# Patient Record
Sex: Female | Born: 1935 | Race: White | Hispanic: No | Smoking: Never smoker
Health system: Southern US, Community
[De-identification: ages and names within clinical notes are randomized; demographics above are authoritative.]

## PROBLEM LIST (undated history)

## (undated) DIAGNOSIS — J984 Other disorders of lung: Secondary | ICD-10-CM

## (undated) DIAGNOSIS — E039 Hypothyroidism, unspecified: Secondary | ICD-10-CM

## (undated) DIAGNOSIS — E871 Hypo-osmolality and hyponatremia: Secondary | ICD-10-CM

## (undated) HISTORY — DX: Hypo-osmolality and hyponatremia: E87.1

## (undated) HISTORY — DX: Other disorders of lung: J98.4

## (undated) HISTORY — PX: APPENDECTOMY: SHX54

## (undated) HISTORY — DX: Hypothyroidism, unspecified: E03.9

---
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
## 2006-03-04 ENCOUNTER — Emergency Department: Payer: Self-pay | Admitting: Unknown Physician Specialty

## 2010-08-21 ENCOUNTER — Inpatient Hospital Stay: Payer: Self-pay | Admitting: Specialist

## 2010-08-24 LAB — PATHOLOGY REPORT

## 2010-08-26 ENCOUNTER — Encounter: Payer: Self-pay | Admitting: Internal Medicine

## 2010-09-20 ENCOUNTER — Encounter: Payer: Self-pay | Admitting: Internal Medicine

## 2012-05-22 HISTORY — PX: HIP SURGERY: SHX245

## 2013-08-06 DIAGNOSIS — H2510 Age-related nuclear cataract, unspecified eye: Secondary | ICD-10-CM | POA: Diagnosis not present

## 2014-03-10 DIAGNOSIS — Z23 Encounter for immunization: Secondary | ICD-10-CM | POA: Diagnosis not present

## 2014-07-13 DIAGNOSIS — H04121 Dry eye syndrome of right lacrimal gland: Secondary | ICD-10-CM | POA: Diagnosis not present

## 2015-07-02 ENCOUNTER — Emergency Department: Payer: Medicare Other

## 2015-07-02 ENCOUNTER — Encounter: Payer: Self-pay | Admitting: Emergency Medicine

## 2015-07-02 ENCOUNTER — Inpatient Hospital Stay
Admission: EM | Admit: 2015-07-02 | Discharge: 2015-07-05 | DRG: 641 | Disposition: A | Discharge status: Skilled Nursing Facility | Payer: Medicare Other | Attending: Internal Medicine | Admitting: Internal Medicine

## 2015-07-02 DIAGNOSIS — E039 Hypothyroidism, unspecified: Secondary | ICD-10-CM | POA: Diagnosis present

## 2015-07-02 DIAGNOSIS — R7989 Other specified abnormal findings of blood chemistry: Secondary | ICD-10-CM | POA: Diagnosis present

## 2015-07-02 DIAGNOSIS — M6281 Muscle weakness (generalized): Secondary | ICD-10-CM | POA: Diagnosis not present

## 2015-07-02 DIAGNOSIS — R778 Other specified abnormalities of plasma proteins: Secondary | ICD-10-CM

## 2015-07-02 DIAGNOSIS — E860 Dehydration: Secondary | ICD-10-CM | POA: Diagnosis present

## 2015-07-02 DIAGNOSIS — Z9889 Other specified postprocedural states: Secondary | ICD-10-CM

## 2015-07-02 DIAGNOSIS — R748 Abnormal levels of other serum enzymes: Secondary | ICD-10-CM | POA: Diagnosis present

## 2015-07-02 DIAGNOSIS — E559 Vitamin D deficiency, unspecified: Secondary | ICD-10-CM | POA: Diagnosis present

## 2015-07-02 DIAGNOSIS — R946 Abnormal results of thyroid function studies: Secondary | ICD-10-CM | POA: Diagnosis present

## 2015-07-02 DIAGNOSIS — Z9119 Patient's noncompliance with other medical treatment and regimen: Secondary | ICD-10-CM

## 2015-07-02 DIAGNOSIS — I10 Essential (primary) hypertension: Secondary | ICD-10-CM | POA: Diagnosis present

## 2015-07-02 DIAGNOSIS — Z79899 Other long term (current) drug therapy: Secondary | ICD-10-CM | POA: Diagnosis not present

## 2015-07-02 DIAGNOSIS — R531 Weakness: Secondary | ICD-10-CM

## 2015-07-02 DIAGNOSIS — W19XXXA Unspecified fall, initial encounter: Secondary | ICD-10-CM | POA: Diagnosis not present

## 2015-07-02 DIAGNOSIS — E871 Hypo-osmolality and hyponatremia: Secondary | ICD-10-CM | POA: Diagnosis present

## 2015-07-02 DIAGNOSIS — Z8249 Family history of ischemic heart disease and other diseases of the circulatory system: Secondary | ICD-10-CM | POA: Diagnosis not present

## 2015-07-02 DIAGNOSIS — Z886 Allergy status to analgesic agent status: Secondary | ICD-10-CM

## 2015-07-02 DIAGNOSIS — S22000A Wedge compression fracture of unspecified thoracic vertebra, initial encounter for closed fracture: Secondary | ICD-10-CM | POA: Diagnosis not present

## 2015-07-02 DIAGNOSIS — Z66 Do not resuscitate: Secondary | ICD-10-CM | POA: Diagnosis present

## 2015-07-02 DIAGNOSIS — Z9181 History of falling: Secondary | ICD-10-CM | POA: Diagnosis not present

## 2015-07-02 DIAGNOSIS — R749 Abnormal serum enzyme level, unspecified: Secondary | ICD-10-CM | POA: Diagnosis not present

## 2015-07-02 DIAGNOSIS — R410 Disorientation, unspecified: Secondary | ICD-10-CM | POA: Diagnosis not present

## 2015-07-02 DIAGNOSIS — R2689 Other abnormalities of gait and mobility: Secondary | ICD-10-CM | POA: Diagnosis not present

## 2015-07-02 HISTORY — DX: Other disorders of lung: J98.4

## 2015-07-02 LAB — URINALYSIS COMPLETE WITH MICROSCOPIC (ARMC ONLY)
BACTERIA UA: NONE SEEN
Bilirubin Urine: NEGATIVE
Glucose, UA: 50 mg/dL — AB
Leukocytes, UA: NEGATIVE
NITRITE: NEGATIVE
PROTEIN: 30 mg/dL — AB
Specific Gravity, Urine: 1.012 (ref 1.005–1.030)
pH: 8 (ref 5.0–8.0)

## 2015-07-02 LAB — TSH: TSH: 20.329 u[IU]/mL — AB (ref 0.350–4.500)

## 2015-07-02 LAB — COMPREHENSIVE METABOLIC PANEL
ALT: 16 U/L (ref 14–54)
AST: 73 U/L — ABNORMAL HIGH (ref 15–41)
Albumin: 4.1 g/dL (ref 3.5–5.0)
Alkaline Phosphatase: 64 U/L (ref 38–126)
Anion gap: 12 (ref 5–15)
BUN: 10 mg/dL (ref 6–20)
CHLORIDE: 91 mmol/L — AB (ref 101–111)
CO2: 24 mmol/L (ref 22–32)
CREATININE: 0.87 mg/dL (ref 0.44–1.00)
Calcium: 8.8 mg/dL — ABNORMAL LOW (ref 8.9–10.3)
GFR, EST NON AFRICAN AMERICAN: 59 mL/min — AB (ref >60)
Glucose, Bld: 150 mg/dL — ABNORMAL HIGH (ref 65–99)
POTASSIUM: 3.8 mmol/L (ref 3.5–5.1)
Sodium: 127 mmol/L — ABNORMAL LOW (ref 135–145)
TOTAL PROTEIN: 8.6 g/dL — AB (ref 6.5–8.1)
Total Bilirubin: 1.3 mg/dL — ABNORMAL HIGH (ref 0.3–1.2)

## 2015-07-02 LAB — CBC WITH DIFFERENTIAL/PLATELET
BASOS ABS: 0 10*3/uL (ref 0–0.1)
BASOS PCT: 0 %
Eosinophils Absolute: 0 10*3/uL (ref 0–0.7)
Eosinophils Relative: 0 %
HCT: 41.5 % (ref 35.0–47.0)
HEMOGLOBIN: 13.7 g/dL (ref 12.0–16.0)
LYMPHS PCT: 7 %
Lymphs Abs: 0.4 10*3/uL — ABNORMAL LOW (ref 1.0–3.6)
MCH: 28.8 pg (ref 26.0–34.0)
MCHC: 33 g/dL (ref 32.0–36.0)
MCV: 87.3 fL (ref 80.0–100.0)
Monocytes Absolute: 0.6 10*3/uL (ref 0.2–0.9)
Monocytes Relative: 11 %
Neutro Abs: 4.7 10*3/uL (ref 1.4–6.5)
Neutrophils Relative %: 82 %
Platelets: 258 10*3/uL (ref 150–440)
RBC: 4.75 MIL/uL (ref 3.80–5.20)
RDW: 14.1 % (ref 11.5–14.5)
WBC: 5.7 10*3/uL (ref 3.6–11.0)

## 2015-07-02 LAB — TROPONIN I
TROPONIN I: 0.35 ng/mL — AB (ref <0.031)
TROPONIN I: 0.34 ng/mL — AB (ref <0.031)
TROPONIN I: 0.31 ng/mL — AB (ref <0.031)

## 2015-07-02 LAB — CK: CK TOTAL: 1608 U/L — AB (ref 38–234)

## 2015-07-02 MED ORDER — SODIUM CHLORIDE 0.9% FLUSH
3.0000 mL | Freq: Two times a day (BID) | INTRAVENOUS | Status: DC
  Administered 2015-07-03 (×2): 3 mL via INTRAVENOUS

## 2015-07-02 MED ORDER — ACETAMINOPHEN 650 MG RE SUPP: 650.0000 mg | Freq: Four times a day (QID) | RECTAL | Status: DC | PRN

## 2015-07-02 MED ORDER — ASPIRIN EC 81 MG PO TBEC
81.0000 mg | DELAYED_RELEASE_TABLET | Freq: Every day | ORAL | Status: DC
  Administered 2015-07-03 – 2015-07-05 (×3): 81 mg via ORAL
  Filled 2015-07-02 (×3): qty 1

## 2015-07-02 MED ORDER — CEFUROXIME AXETIL 500 MG PO TABS: 500.0000 mg | ORAL_TABLET | Freq: Two times a day (BID) | ORAL | Status: DC

## 2015-07-02 MED ORDER — ONDANSETRON HCL 4 MG/2ML IJ SOLN: 4.0000 mg | Freq: Four times a day (QID) | INTRAMUSCULAR | Status: DC | PRN

## 2015-07-02 MED ORDER — METOPROLOL TARTRATE 25 MG PO TABS
12.5000 mg | ORAL_TABLET | Freq: Two times a day (BID) | ORAL | Status: DC
  Administered 2015-07-02 – 2015-07-05 (×6): 12.5 mg via ORAL
  Filled 2015-07-02 (×6): qty 1

## 2015-07-02 MED ORDER — ASPIRIN 81 MG PO CHEW
324.0000 mg | CHEWABLE_TABLET | Freq: Once | ORAL | Status: AC
  Administered 2015-07-02: 324 mg via ORAL
  Filled 2015-07-02: qty 4

## 2015-07-02 MED ORDER — ACETAMINOPHEN 325 MG PO TABS
650.0000 mg | ORAL_TABLET | Freq: Four times a day (QID) | ORAL | Status: DC | PRN
  Administered 2015-07-03 – 2015-07-04 (×2): 650 mg via ORAL
  Filled 2015-07-02 (×3): qty 2

## 2015-07-02 MED ORDER — SODIUM CHLORIDE 0.9 % IV BOLUS (SEPSIS)
1000.0000 mL | Freq: Once | INTRAVENOUS | Status: AC
  Administered 2015-07-02: 1000 mL via INTRAVENOUS

## 2015-07-02 MED ORDER — SODIUM CHLORIDE 0.9 % IV SOLN
INTRAVENOUS | Status: DC
  Administered 2015-07-02 – 2015-07-05 (×5): via INTRAVENOUS

## 2015-07-02 MED ORDER — ONDANSETRON HCL 4 MG PO TABS: 4.0000 mg | ORAL_TABLET | Freq: Four times a day (QID) | ORAL | Status: DC | PRN

## 2015-07-02 MED ORDER — LEVOTHYROXINE SODIUM 75 MCG PO TABS
75.0000 ug | ORAL_TABLET | Freq: Every day | ORAL | Status: DC
  Administered 2015-07-03: 75 ug via ORAL
  Filled 2015-07-02: qty 1

## 2015-07-02 MED ORDER — ENOXAPARIN SODIUM 40 MG/0.4ML ~~LOC~~ SOLN
40.0000 mg | SUBCUTANEOUS | Status: DC
  Administered 2015-07-02: 18:00:00 40 mg via SUBCUTANEOUS
  Filled 2015-07-02: qty 0.4

## 2015-07-02 NOTE — H&P (Signed)
[facility] - [place] at [facility]   PATIENT NAME: Maria Walker    MR#:  [id number]  DATE OF BIRTH:  [birth year]  DATE OF ADMISSION:  07/02/2015  PRIMARY CARE PHYSICIAN: Dr. Quillian Quince REQUESTING/REFERRING PHYSICIAN: Dr. Shaune Pollack  CHIEF COMPLAINT:   Chief Complaint  Patient presents with  . Weakness    HISTORY OF PRESENT ILLNESS: Maria Walker  is a 82 y.o. female with a known history of upper thyroidism who currently lives by herself. Her family was called by her neighbor because he noticed that patient was on the floor. When EMS arrived patient had a hard time getting off from the floor. She has not been eating or drinking much recently. Her daughter and granddaughter feel that she is very dehydrated. Patient in the ER was noted to have a sodium of 127 and troponin thus elevated. She denies any chest pain.    PAST MEDICAL HISTORY:   Past Medical History  Diagnosis Date  . Thyroid disease   . Apical lung scarring     PAST SURGICAL HISTORY: Past Surgical History  Procedure Laterality Date  . Hip surgery      SOCIAL HISTORY:  Social History  Substance Use Topics  . Smoking status: Never Smoker   . Smokeless tobacco: Not on file  . Alcohol Use: No    FAMILY HISTORY:  Family History  Problem Relation Age of Onset  . Hypertension           DRUG ALLERGIES:  Allergies  Allergen Reactions  . Codeine Other (See Comments)    Reaction:  Hallucinations     REVIEW OF SYSTEMS:   CONSTITUTIONAL: No fever, positive fatigue and weakness.  EYES: No blurred or double vision.  EARS, NOSE, AND THROAT: No tinnitus or ear pain.  RESPIRATORY: No cough, shortness of breath, wheezing or hemoptysis.  CARDIOVASCULAR: No chest pain, orthopnea, edema.  GASTROINTESTINAL: No nausea, vomiting, diarrhea or abdominal pain.  GENITOURINARY: No dysuria, hematuria.  ENDOCRINE: No polyuria, nocturia,  HEMATOLOGY: No anemia, easy bruising or bleeding SKIN: No rash or  lesion. MUSCULOSKELETAL: No joint pain or arthritis.   NEUROLOGIC: No tingling, numbness, weakness.  PSYCHIATRY: No anxiety or depression.   MEDICATIONS AT HOME:  Prior to Admission medications   Medication Sig Start Date End Date Taking? Authorizing Provider  levothyroxine (SYNTHROID, LEVOTHROID) 75 MCG tablet Take 75 mcg by mouth daily before breakfast.   Yes Historical Provider, MD      PHYSICAL EXAMINATION:   VITAL SIGNS: Blood pressure 135/83, pulse 106, temperature 98 F (36.7 C), temperature source Oral, resp. rate 23, height  (1.549 m), weight 42.411 kg (93 lb 8 oz), SpO2 96 %.  GENERAL:  82 y.o.-year-old patient lying in the bed with no acute distress.  EYES: Pupils equal, round, reactive to light and accommodation. No scleral icterus. Extraocular muscles intact.  HEENT: Head atraumatic, normocephalic. Oropharynx and nasopharynx clear.  NECK:  Supple, no jugular venous distention. No thyroid enlargement, no tenderness.  LUNGS: Normal breath sounds bilaterally, no wheezing, rales,rhonchi or crepitation. No use of accessory muscles of respiration.  CARDIOVASCULAR: S1, S2 normal. No murmurs, rubs, or gallops.  ABDOMEN: Soft, nontender, nondistended. Bowel sounds present. No organomegaly or mass.  EXTREMITIES: No pedal edema, cyanosis, or clubbing.  NEUROLOGIC: Cranial nerves II through XII are intact. Muscle strength 5/5 in all extremities. Sensation intact. Gait not checked.  PSYCHIATRIC: The patient is alert and oriented x 3.  SKIN: No obvious rash, lesion, or ulcer.  LABORATORY PANEL:   CBC  Recent Labs Lab 07/02/15 1225  WBC 5.7  HGB 13.7  HCT 41.5  PLT 258  MCV 87.3  MCH 28.8  MCHC 33.0  RDW 14.1  LYMPHSABS 0.4*  MONOABS 0.6  EOSABS 0.0  BASOSABS 0.0   ------------------------------------------------------------------------------------------------------------------  Chemistries   Recent Labs Lab 07/02/15 1225  NA 127*  K 3.8  CL 91*  CO2 24   GLUCOSE 150*  BUN 10  CREATININE 0.87  CALCIUM 8.8*  AST 73*  ALT 16  ALKPHOS 64  BILITOT 1.3*   ------------------------------------------------------------------------------------------------------------------ estimated creatinine clearance is 31.1 mL/min (by C-G formula based on Cr of 0.87). ------------------------------------------------------------------------------------------------------------------ No results for input(s): TSH, T4TOTAL, T3FREE, THYROIDAB in the last 72 hours.  Invalid input(s): FREET3   Coagulation profile No results for input(s): INR, PROTIME in the last 168 hours. ------------------------------------------------------------------------------------------------------------------- No results for input(s): DDIMER in the last 72 hours. -------------------------------------------------------------------------------------------------------------------  Cardiac Enzymes  Recent Labs Lab 07/02/15 1225  TROPONINI 0.35*   ------------------------------------------------------------------------------------------------------------------ Invalid input(s): POCBNP  ---------------------------------------------------------------------------------------------------------------  Urinalysis    Component Value Date/Time   COLORURINE YELLOW* 07/02/2015 1225   APPEARANCEUR CLEAR* 07/02/2015 1225   LABSPEC 1.012 07/02/2015 1225   PHURINE 8.0 07/02/2015 1225   GLUCOSEU 50* 07/02/2015 1225   HGBUR 2+* 07/02/2015 1225   BILIRUBINUR NEGATIVE 07/02/2015 1225   KETONESUR 1+* 07/02/2015 1225   PROTEINUR 30* 07/02/2015 1225   NITRITE NEGATIVE 07/02/2015 1225   LEUKOCYTESUR NEGATIVE 07/02/2015 1225     RADIOLOGY: Dg Chest 2 View  07/02/2015  CLINICAL DATA:  Status post fall out of a chair today. Initial encounter. CHEST  2 VIEW COMPARISON:  Single view of the chest 08/21/2010. CT thoracic spine 03/04/2006. FINDINGS: Lung volumes are somewhat low with mild left  basilar atelectasis. No consolidative process, pneumothorax or effusion. Heart size is normal. Bones are osteopenic. Since the prior examination, since the prior CT scan, the patient has suffered compression fractures of T6, T7, T8, T9. T10 and T11. There is also mild inferior endplate compression fracture of T10. IMPRESSION: No acute cardiopulmonary disease. Multiple thoracic spine compression fractures are new since 2007 but cannot be definitively characterized. T5 compression fracture is remote. Osteopenia. Electronically Signed   By: Drusilla Kanner M.D.   On: 07/02/2015 15:25   Ct Head Wo Contrast  07/02/2015  CLINICAL DATA:  Confusion. EXAM: CT HEAD WITHOUT CONTRAST TECHNIQUE: Contiguous axial images were obtained from the base of the skull through the vertex without intravenous contrast. COMPARISON:  None. FINDINGS: Periventricular white matter and corona radiata hypodensities favor chronic ischemic microvascular white matter disease. This is somewhat confluent in the left frontal periventricular white matter. Otherwise, the brainstem, cerebellum, cerebral peduncles, thalami, basal ganglia, basilar cisterns, and ventricular system appear within normal limits. No intracranial hemorrhage, mass lesion, or acute CVA. Chronic right posterior ethmoid and left sphenoid sinusitis. There is atherosclerotic calcification of the cavernous carotid arteries bilaterally. IMPRESSION: 1. No acute intracranial findings are identified. 2. Periventricular white matter and corona radiata hypodensities favor chronic ischemic microvascular white matter disease. 3. Chronic ethmoid and left sphenoid sinusitis. Electronically Signed   By: Gaylyn Rong M.D.   On: 07/02/2015 15:32    EKG: Orders placed or performed during the hospital encounter of 07/02/15  . ED EKG  . ED EKG  . EKG 12-Lead  . EKG 12-Lead  . EKG 12-Lead  . EKG 12-Lead    IMPRESSION AND PLAN: Patient is a 82 year old white female brought in to  the hospital with  complaint of generalized weakness and a fall  1. Generalized weakness and fall likely due to dehydration at this time will give her IV fluids physical therapy evaluation  2. Hyponatremia likely due to dehydration give her IV fluids Follow sodium level in the morning  3. Hypothyroidism continue Synthroid  4. Elevated troponin without cardiac symptoms I will check a CPK could be related to the fall demand ischemia  5. Miscellaneous heparin for DVT prophylaxis   All the records are reviewed and case discussed with ED provider. Management plans discussed with the patient, family and they are in agreement.  CODE STATUS:    Code Status Orders        Start     Ordered   07/02/15 1621  Full code   Continuous     07/02/15 1621    Code Status History    Date Active Date Inactive Code Status Order ID Comments User Context   This patient has a current code status but no historical code status.       TOTAL TIME TAKING CARE OF THIS PATIENT: 45 minutes.    Auburn Bilberry M.D on 07/02/2015 at 4:28 PM  Between 7am to 6pm - Pager - [phone]  After 6pm go to [email or web address] - password [email or web address] [facility]  [place]  Office  [phone]  CC: Primary care physician; No primary care provider on file.

## 2015-07-02 NOTE — ED Notes (Signed)
Pt taken to room 36 for urine sample and to be put into a gown.

## 2015-07-02 NOTE — ED Notes (Signed)
Pt arrived via EMS from home where she lives independently.  Pt has neighbors that check on her periodically and was found to have slid out of her recliner chair on the floor.  EMS reports patient may have been down for 10 minutes or less.  Pt unable to ambulate like she normally does per family.  EMS states they were at the pt's house for a significant period of time and was still unable to ambulate with EMS.  Pt also urinated on herself while she was on the floor.  Upon arrival pt is AOx4. Mucus membranes are dry.

## 2015-07-02 NOTE — ED Notes (Signed)
Dr. Shaune Pollack notified of patient's elevated troponin.

## 2015-07-02 NOTE — ED Provider Notes (Signed)
[facility] Emergency Department Provider Note   ____________________________________________  Time seen: Approximately 12 PM I have reviewed the triage vital signs and the triage nursing note.  HISTORY  Chief Complaint Weakness   Historian Patient  HPI Maria Walker is a 82 y.o. female who lives alone, and the neighbor checked on her today and found that she had slid out of her recliner. Patient states that her both legs have been weak for a few days. No one-sided weakness. No slurred speech. She states her mouth is dry and she hasn't really had anything to drink today. No fever. No coughing. The chest pain.  Initially had complained a little bit of low back pain, but now states she is not really having any pain. Symptoms are moderate.    Past Medical History  Diagnosis Date  . Thyroid disease   . Apical lung scarring     Patient Active Problem List   Diagnosis Date Noted  . Hyponatremia 07/02/2015    Past Surgical History  Procedure Laterality Date  . Hip surgery      No current outpatient prescriptions on file.  Allergies Codeine  Family History  Problem Relation Age of Onset  . Hypertension           Social History Social History  Substance Use Topics  . Smoking status: Never Smoker   . Smokeless tobacco: None  . Alcohol Use: No    Review of Systems  Constitutional: Negative for fever. Eyes: Negative for visual changes. ENT: Negative for sore throat. Cardiovascular: Negative for chest pain. Respiratory: Negative for shortness of breath. Gastrointestinal: Negative for abdominal pain, vomiting and diarrhea. Genitourinary: Negative for dysuria. Musculoskeletal: Negative for back pain. Skin: Negative for rash. Neurological: Negative for headache. 10 point Review of Systems otherwise negative ____________________________________________   PHYSICAL EXAM:  VITAL SIGNS: ED Triage Vitals  Enc Vitals Group     BP --       Pulse --      Resp --      Temp --      Temp src --      SpO2 07/02/15 1210 100 %     Weight --      Height --      Head Cir --      Peak Flow --      Pain Score 07/02/15 1214 0     Pain Loc --      Pain Edu? --      Excl. in GC? --      Constitutional: Alert and oriented. Well appearing and in no distress. HEENT   Head: Normocephalic and atraumatic.      Eyes: Conjunctivae are normal. PERRL. Normal extraocular movements.      Ears:         Nose: No congestion/rhinnorhea.   Mouth/Throat: Mucous membranes are moderately dry   Neck: No stridor. Cardiovascular/Chest: Tachycardic and regular  No murmurs, rubs, or gallops. Respiratory: Normal respiratory effort without tachypnea nor retractions. Breath sounds are clear and equal bilaterally. No wheezes/rales/rhonchi. Gastrointestinal: Soft. No distention, no guarding, no rebound. Nontender.    Genitourinary/rectal:Deferred Musculoskeletal: Pelvis stable full range of motion of the hips which are nontender. Nontender C-spine, T-spine, and L-spine to palpation. Nontender with normal range of motion in all extremities. No joint effusions.  No lower extremity tenderness.  No edema. Neurologic:  Normal speech and language. No gross or focal neurologic deficits are appreciated. Skin:  Skin is warm, dry and intact.  No rash noted. Psychiatric: Mood and affect are normal. Speech and behavior are normal. Patient exhibits appropriate insight and judgment.  ____________________________________________   EKG I, Governor Rooks, MD, the attending physician have personally viewed and interpreted all ECGs.  EKG 12:20 sinus tachycardia. 120 bpm. Narrow QRS. Normal axis. Occasional PVC. Nonspecific ST and T-wave  EKG 12:22 sinus tachycardia. 1 26 bpm. Narrow QRS. Undetermined axis. Nonspecific ST and T-wave ____________________________________________  LABS (pertinent positives/negatives)  Sodium 127, chloride 91 otherwise middle panel  without significant abnormality Troponin 0.35 White blood count 5.7, hemoglobin 13.7, platelet count 258 Urinalysis too numerous to count red blood cells, negative for bacteria and nitrites, 1+ ketones  ____________________________________________  RADIOLOGY All Xrays were viewed by me. Imaging interpreted by Radiologist.  Chest 2 view:  IMPRESSION: No acute cardiopulmonary disease.  Multiple thoracic spine compression fractures are new since 2007 but cannot be definitively characterized. T5 compression fracture is remote.  Osteopenia. __________________________________________  PROCEDURES  Procedure(s) performed: None  Critical Care performed: None  ____________________________________________   ED COURSE / ASSESSMENT AND PLAN  Pertinent labs & imaging results that were available during my care of the patient were reviewed by me and considered in my medical decision making (see chart for details).   Patient states that she's been weak for a couple of days. She does not have a focal deficit and I don't have any clinical concern for an acute stroke. She appears like she may be dehydrated. Her BUN and creatinine are within normal limits. She does have acute hyponatremia at 127, I am going to give her 1 L normal saline now.  Additionally, she is tachycardic with a minimally elevated troponin. I will give her 4 baby aspirin. Her EKG does not show an acute ischemia, but I will admit her for observation for evaluation and treatment of her abnormal troponin and weakness generalized, and hyponatremia.    CONSULTATIONS:   Face-to-face with hospitalist for admission.   Patient / Family / Caregiver informed of clinical course, medical decision-making process, and agree with plan.     ___________________________________________   FINAL CLINICAL IMPRESSION(S) / ED DIAGNOSES   Final diagnoses:  Troponin level elevated  Hyponatremia  Generalized weakness               Note: This dictation was prepared with Dragon dictation. Any transcriptional errors that result from this process are unintentional   Governor Rooks, MD 07/02/15 2125

## 2015-07-02 NOTE — Progress Notes (Addendum)
Day shift had called to get code status changed from full to DNR per pt request- Follow up call to change code status.  Dr. Elpidio Anis notified- order to be changed to DNR per pt wishes. Louis Meckel

## 2015-07-03 DIAGNOSIS — R7989 Other specified abnormal findings of blood chemistry: Secondary | ICD-10-CM | POA: Diagnosis present

## 2015-07-03 LAB — BASIC METABOLIC PANEL WITH GFR
Anion gap: 5 (ref 5–15)
BUN: 15 mg/dL (ref 6–20)
CO2: 24 mmol/L (ref 22–32)
Calcium: 7.4 mg/dL — ABNORMAL LOW (ref 8.9–10.3)
Chloride: 99 mmol/L — ABNORMAL LOW (ref 101–111)
Creatinine, Ser: 0.86 mg/dL (ref 0.44–1.00)
GFR calc Af Amer: >60 mL/min
GFR calc non Af Amer: 59 mL/min — ABNORMAL LOW
Glucose, Bld: 91 mg/dL (ref 65–99)
Potassium: 3.5 mmol/L (ref 3.5–5.1)
Sodium: 128 mmol/L — ABNORMAL LOW (ref 135–145)

## 2015-07-03 LAB — CBC
HCT: 34.6 % — ABNORMAL LOW (ref 35.0–47.0)
Hemoglobin: 11.6 g/dL — ABNORMAL LOW (ref 12.0–16.0)
MCH: 28.9 pg (ref 26.0–34.0)
MCHC: 33.5 g/dL (ref 32.0–36.0)
MCV: 86.3 fL (ref 80.0–100.0)
Platelets: 214 10*3/uL (ref 150–440)
RBC: 4.01 MIL/uL (ref 3.80–5.20)
RDW: 14.5 % (ref 11.5–14.5)
WBC: 4.6 10*3/uL (ref 3.6–11.0)

## 2015-07-03 LAB — BASIC METABOLIC PANEL
Anion gap: 7 (ref 5–15)
BUN: 15 mg/dL (ref 6–20)
CALCIUM: 7.9 mg/dL — AB (ref 8.9–10.3)
CO2: 24 mmol/L (ref 22–32)
CREATININE: 1.02 mg/dL — AB (ref 0.44–1.00)
Chloride: 96 mmol/L — ABNORMAL LOW (ref 101–111)
GFR, EST AFRICAN AMERICAN: 56 mL/min — AB (ref >60)
GFR, EST NON AFRICAN AMERICAN: 48 mL/min — AB (ref >60)
Glucose, Bld: 105 mg/dL — ABNORMAL HIGH (ref 65–99)
Potassium: 3.4 mmol/L — ABNORMAL LOW (ref 3.5–5.1)
Sodium: 127 mmol/L — ABNORMAL LOW (ref 135–145)

## 2015-07-03 LAB — SODIUM, URINE, RANDOM: Sodium, Ur: 189 mmol/L

## 2015-07-03 LAB — VITAMIN D 25 HYDROXY (VIT D DEFICIENCY, FRACTURES): Vit D, 25-Hydroxy: 20.5 ng/mL — ABNORMAL LOW (ref 30.0–100.0)

## 2015-07-03 LAB — CK: Total CK: 1086 U/L — ABNORMAL HIGH (ref 38–234)

## 2015-07-03 LAB — VITAMIN B12: VITAMIN B 12: 253 pg/mL (ref 180–914)

## 2015-07-03 LAB — OSMOLALITY, URINE: Osmolality, Ur: 546 mosm/kg (ref 300–900)

## 2015-07-03 LAB — TSH: TSH: 29.96 u[IU]/mL — ABNORMAL HIGH (ref 0.350–4.500)

## 2015-07-03 LAB — T4, FREE: Free T4: 0.59 ng/dL — ABNORMAL LOW (ref 0.61–1.12)

## 2015-07-03 LAB — TROPONIN I: TROPONIN I: 0.27 ng/mL — AB (ref <0.031)

## 2015-07-03 MED ORDER — LEVOTHYROXINE SODIUM 150 MCG PO TABS: 150.0000 ug | ORAL_TABLET | Freq: Every day | ORAL | Status: DC

## 2015-07-03 MED ORDER — LEVOTHYROXINE SODIUM 100 MCG IV SOLR
25.0000 ug | Freq: Once | INTRAVENOUS | Status: AC
Start: 2015-07-03 — End: 2015-07-03
  Administered 2015-07-03: 25 ug via INTRAVENOUS
  Filled 2015-07-03: qty 5

## 2015-07-03 MED ORDER — LEVOTHYROXINE SODIUM 100 MCG PO TABS
100.0000 ug | ORAL_TABLET | Freq: Every day | ORAL | Status: DC
  Administered 2015-07-04 – 2015-07-05 (×2): 100 ug via ORAL
  Filled 2015-07-03 (×2): qty 1

## 2015-07-03 MED ORDER — VITAMIN D 1000 UNITS PO TABS
1000.0000 [IU] | ORAL_TABLET | Freq: Every day | ORAL | Status: DC
  Administered 2015-07-03 – 2015-07-05 (×3): 1000 [IU] via ORAL
  Filled 2015-07-03 (×3): qty 1

## 2015-07-03 MED ORDER — ENOXAPARIN SODIUM 30 MG/0.3ML ~~LOC~~ SOLN
30.0000 mg | SUBCUTANEOUS | Status: DC
  Administered 2015-07-03: 18:00:00 30 mg via SUBCUTANEOUS
  Filled 2015-07-03: qty 0.3

## 2015-07-03 NOTE — Progress Notes (Signed)
Initial Nutrition Assessment   INTERVENTION:   Meals and Snacks: Cater to patient preferences Medical Food Supplement Therapy: will recommend Mighty Shakes on meal trays TID for added nutrition (each shake provides 300kcals and 9g protein) as pt reports diarrhea with Ensure/Boost Coordination of Care: will recommend collecting accurate weight.   NUTRITION DIAGNOSIS:   Inadequate oral intake related to poor appetite as evidenced by per patient/family report.  GOAL:   Patient will meet greater than or equal to 90% of their needs  MONITOR:    (Energy Intake, Electrolyte and renal Profile, Anthropometrics, Digestive system)  REASON FOR ASSESSMENT:   Malnutrition Screening Tool    ASSESSMENT:   Pt admitted with fall and dehydration with hyponatremia.   Past Medical History  Diagnosis Date  . Thyroid disease   . Apical lung scarring      Diet Order:  Diet regular Room service appropriate?: Yes; Fluid consistency:: Thin    Current Nutrition: Pt reports not having had lunch yet on visit but tray on its way. Pt reports eating breakfast this am, daughter reports pt ate pancakes very well.   Food/Nutrition-Related History: Pt daughter reports pt eats small portions three times a day. Pt's daughter reports pt really struggles drinking enough liquids, sometimes only 2 cups of coffee per day, but eats much better than she drinks liquids. Pt's daughter has encouraged pt to drink at least one [employer] a day. Pt has been drinking [employer] brand, Equate Protein shake with 30g protein mixed with milk but not daily.   Scheduled Medications:  . aspirin EC  81 mg Oral Daily  . cholecalciferol  1,000 Units Oral Daily  . enoxaparin (LOVENOX) injection  30 mg Subcutaneous Q24H  . levothyroxine  25 mcg Intravenous Once  . [START ON 07/04/2015] levothyroxine  150 mcg Oral QAC breakfast  . metoprolol tartrate  12.5 mg Oral BID  . sodium chloride flush  3 mL Intravenous Q12H    Continuous  Medications:  . sodium chloride 75 mL/hr at 07/03/15 0523     Electrolyte/Renal Profile and Glucose Profile:   Recent Labs Lab 07/02/15 1225 07/03/15 0406  NA 127* 127*  K 3.8 3.4*  CL 91* 96*  CO2 24 24  BUN 10 15  CREATININE 0.87 1.02*  CALCIUM 8.8* 7.9*  GLUCOSE 150* 105*   Protein Profile:   Recent Labs Lab 07/02/15 1225  ALBUMIN 4.1    Gastrointestinal Profile: Last BM:  07/01/2015   Nutrition-Focused Physical Exam Findings: Nutrition-Focused physical exam completed. Findings are no fat depletion, mild-moderate muscle depletion, and no edema.     Weight Change: Pt reports weight has been 102lbs usually. Pt's daughter reports thinking pt's weight is less now.  No further weight encounters per [facility]  Skin:   Reviewed, per pt wound on back where slid out of chair PTA  Height:   Ht Readings from Last 1 Encounters:  07/02/15  (1.549 m)    Weight:   Wt Readings from Last 1 Encounters:  07/02/15 102 lb (46.267 kg)   BMI:  Body mass index is 19.28 kg/(m^2).  Estimated Nutritional Needs:   Kcal:  BEE: 837kcals, TEE: (IF 1.1-1.3)(AF 1.2) 1105-1306kcals  Protein:  46-55g protein (1.0-1.2g/kg)  Fluid:  1150-1329mL of fluid (25-43mL/kg)  EDUCATION NEEDS:   No education needs identified at this time   MODERATE Care Level  Leda Quail, RD, LDN Pager [phone] Weekend/On-Call Pager [phone]

## 2015-07-03 NOTE — Clinical Social Work Placement (Signed)
   CLINICAL SOCIAL WORK PLACEMENT  NOTE  Date:  07/03/2015  Patient Details  Name: Maria Walker MRN: [id number] Date of Birth: [birth year]  Clinical Social Work is seeking post-discharge placement for this patient at the Skilled  Nursing Facility level of care (*CSW will initial, date and re-position this form in  chart as items are completed):  Yes   Patient/family provided with [place] Clinical Social Work Department's list of facilities offering this level of care within the geographic area requested by the patient (or if unable, by the patient's family).  Yes   Patient/family informed of their freedom to choose among providers that offer the needed level of care, that participate in Medicare, Medicaid or managed care program needed by the patient, have an available bed and are willing to accept the patient.  Yes   Patient/family informed of [facility]'s ownership interest in [facility] and [facility], as well as of the fact that they are under no obligation to receive care at these facilities.  PASRR submitted to EDS on       PASRR number received on       Existing PASRR number confirmed on 07/03/15     FL2 transmitted to all facilities in geographic area requested by pt/family on 07/03/15     FL2 transmitted to all facilities within larger geographic area on       Patient informed that his/her managed care company has contracts with or will negotiate with certain facilities, including the following:            Patient/family informed of bed offers received.  Patient chooses bed at       Physician recommends and patient chooses bed at      Patient to be transferred to   on  .  Patient to be transferred to facility by       Patient family notified on   of transfer.  Name of family member notified:        PHYSICIAN       Additional Comment:    _______________________________________________ Dede Query, LCSW 07/03/2015, 8:06 PM

## 2015-07-03 NOTE — Progress Notes (Signed)
[facility] [place] at [facility]   PATIENT NAME: Maria Walker    MR#:  [id number]  DATE OF BIRTH:  Jan 05, 1927  SUBJECTIVE:  CHIEF COMPLAINT:   Chief Complaint  Patient presents with  . Weakness   states that she feels somewhat better today however still weak, she had to ask for assistance to get out of bed   REVIEW OF SYSTEMS:  CONSTITUTIONAL: No fever, positive fatigue or weakness.  EYES: No blurred or double vision.  EARS, NOSE, AND THROAT: No tinnitus or ear pain.  RESPIRATORY: No cough, shortness of breath, wheezing or hemoptysis.  CARDIOVASCULAR: No chest pain, orthopnea, edema.  GASTROINTESTINAL: No nausea, vomiting, diarrhea or abdominal pain.  GENITOURINARY: No dysuria, hematuria.  ENDOCRINE: No polyuria, nocturia,  HEMATOLOGY: No anemia, easy bruising or bleeding SKIN: No rash or lesion. MUSCULOSKELETAL: No joint pain or arthritis.   NEUROLOGIC: No tingling, numbness, weakness.  PSYCHIATRY: No anxiety or depression.   DRUG ALLERGIES:   Allergies  Allergen Reactions  . Codeine Other (See Comments)    Reaction:  Hallucinations     VITALS:  Blood pressure 141/63, pulse 89, temperature 97.8 F (36.6 C), temperature source Oral, resp. rate 20, height  (1.549 m), weight 46.267 kg (102 lb), SpO2 100 %.  PHYSICAL EXAMINATION:  VITAL SIGNS: Filed Vitals:   07/03/15 0521 07/03/15 0929  BP: 117/72 141/63  Pulse: 85 89  Temp: 98.5 F (36.9 C) 97.8 F (36.6 C)  Resp: 20    GENERAL:82 y.o.female currently in no acute distress.  HEAD: Normocephalic, atraumatic.  EYES: Pupils equal, round, reactive to light. Extraocular muscles intact. No scleral icterus.  MOUTH: Moist mucosal membrane. Dentition intact. No abscess noted.  EAR, NOSE, THROAT: Clear without exudates. No external lesions.  NECK: Supple. No thyromegaly. No nodules. No JVD.  PULMONARY: Clear to ascultation, without wheeze rails or rhonci. No use of accessory muscles, Good  respiratory effort. good air entry bilaterally CHEST: Nontender to palpation.  CARDIOVASCULAR: S1 and S2. Regular rate and rhythm. No murmurs, rubs, or gallops. No edema. Pedal pulses 2+ bilaterally.  GASTROINTESTINAL: Soft, nontender, nondistended. No masses. Positive bowel sounds. No hepatosplenomegaly.  MUSCULOSKELETAL: No swelling, clubbing, or edema. Range of motion full in all extremities.  NEUROLOGIC: Cranial nerves II through XII are intact. No gross focal neurological deficits. Sensation intact. Reflexes intact.  SKIN: No ulceration, lesions, rashes, or cyanosis. Skin warm and dry. Turgor intact.  PSYCHIATRIC: Mood, affect within normal limits. The patient is awake, alert and oriented x 3. Insight, judgment intact.      LABORATORY PANEL:   CBC  Recent Labs Lab 07/03/15 0406  WBC 4.6  HGB 11.6*  HCT 34.6*  PLT 214   ------------------------------------------------------------------------------------------------------------------  Chemistries   Recent Labs Lab 07/02/15 1225 07/03/15 0406  NA 127* 127*  K 3.8 3.4*  CL 91* 96*  CO2 24 24  GLUCOSE 150* 105*  BUN 10 15  CREATININE 0.87 1.02*  CALCIUM 8.8* 7.9*  AST 73*  --   ALT 16  --   ALKPHOS 64  --   BILITOT 1.3*  --    ------------------------------------------------------------------------------------------------------------------  Cardiac Enzymes  Recent Labs Lab 07/03/15 0406  TROPONINI 0.27*   ------------------------------------------------------------------------------------------------------------------  RADIOLOGY:  Dg Chest 2 View  07/02/2015  CLINICAL DATA:  Status post fall out of a chair today. Initial encounter. CHEST  2 VIEW COMPARISON:  Single view of the chest 08/21/2010. CT thoracic spine 03/04/2006. FINDINGS: Lung volumes are somewhat low with mild  left basilar atelectasis. No consolidative process, pneumothorax or effusion. Heart size is normal. Bones are osteopenic. Since the prior  examination, since the prior CT scan, the patient has suffered compression fractures of T6, T7, T8, T9. T10 and T11. There is also mild inferior endplate compression fracture of T10. IMPRESSION: No acute cardiopulmonary disease. Multiple thoracic spine compression fractures are new since 2007 but cannot be definitively characterized. T5 compression fracture is remote. Osteopenia. Electronically Signed   By: Drusilla Kanner M.D.   On: 07/02/2015 15:25   Ct Head Wo Contrast  07/02/2015  CLINICAL DATA:  Confusion. EXAM: CT HEAD WITHOUT CONTRAST TECHNIQUE: Contiguous axial images were obtained from the base of the skull through the vertex without intravenous contrast. COMPARISON:  None. FINDINGS: Periventricular white matter and corona radiata hypodensities favor chronic ischemic microvascular white matter disease. This is somewhat confluent in the left frontal periventricular white matter. Otherwise, the brainstem, cerebellum, cerebral peduncles, thalami, basal ganglia, basilar cisterns, and ventricular system appear within normal limits. No intracranial hemorrhage, mass lesion, or acute CVA. Chronic right posterior ethmoid and left sphenoid sinusitis. There is atherosclerotic calcification of the cavernous carotid arteries bilaterally. IMPRESSION: 1. No acute intracranial findings are identified. 2. Periventricular white matter and corona radiata hypodensities favor chronic ischemic microvascular white matter disease. 3. Chronic ethmoid and left sphenoid sinusitis. Electronically Signed   By: Gaylyn Rong M.D.   On: 07/02/2015 15:32    EKG:   Orders placed or performed during the hospital encounter of 07/02/15  . ED EKG  . ED EKG  . EKG 12-Lead  . EKG 12-Lead  . EKG 12-Lead  . EKG 12-Lead    ASSESSMENT AND PLAN:   82 year old Caucasian female history of hypothyroidism unspecified, essential hypertension presenting with weakness, found to have hyponatremia  1. Hyponatremia: Appears  euvolemic, TSH markedly abnormal which could explain this finding she is currently receiving normal saline at 75 mL/hour. She has 207 mEq sodium deficit which should be correcting within current rate of fluid, check urine sodium urine osmolality  2. Abnormal TSH: Check free T4 to determine thyroid abnormality suspect when corrected thyroid problem sodium also correct 3. Vitamin D deficiency: Replace vitamin D 4. Elevated CK: Continue IV fluids for recheck CK level 5. Elevated troponin: No significant events trending downwards can discontinue to telemetry  6. Venous thromboembolism prophylactic: Lovenox     All the records are reviewed and case discussed with Care Management/Social Workerr. Management plans discussed with the patient, family and they are in agreement.  CODE STATUS: DO NOT RESUSCITATE  TOTAL TIME TAKING CARE OF THIS PATIENT: 35 minutes.   POSSIBLE D/C IN 2-3 DAYS, DEPENDING ON CLINICAL CONDITION.   Derek Huneycutt,  Mardi Mainland.D on 07/03/2015 at 11:12 AM  Between 7am to 6pm - Pager - [phone]  After 6pm: House Pager: - [phone]  [facility]  Office  [phone]  CC: Primary care physician; No primary care provider on file.

## 2015-07-03 NOTE — Progress Notes (Signed)
Anticoagulation Monitoring:  Patient is an 82 yo female with orders for Lovenox 40 mg subq q24h.  Patient's weight is 46 kg and est CrCl~28 mL/min.  Per anticoagulation policy, will transition patient to Lovenox 30 mg subq q24h based on CrCl<30 mL/min.   Hgb: 11.6, plts: 214  Will continue to monitor per policy.   Clarisa Schools, PharmD Clinical Pharmacist 07/03/2015

## 2015-07-03 NOTE — Evaluation (Signed)
Physical Therapy Evaluation Patient Details Name: Maria Walker MRN: [id number] DOB: 1927/01/09 Today's Date: 07/03/2015   History of Present Illness  Pt has been weak the last few dayshere with dehydration/hyponatremia and had a fall with mid thoracic skin tear  Clinical Impression  Pt initially did not feel like doing anything today, but PT and daughter convinced her to to at least try.  She was weak and slow with mobility and ambulation but was able to get to the recliner with only min assist (mod assist for bed mobility).  Pt lives alone and normally has slow, choppy ambulation but is more limited today and did have some unsteadiness though no overt LOBs.  Pt would rather go home if possible, but agrees that right now STR would be the better and safer choice given her status.     Follow Up Recommendations SNF (per progress pt would much rather HHPT)    Equipment Recommendations       Recommendations for Other Services       Precautions / Restrictions Restrictions Weight Bearing Restrictions: No      Mobility  Bed Mobility Overal bed mobility: Needs Assistance Bed Mobility: Supine to Sit     Supine to sit: Mod assist;Min assist     General bed mobility comments: Pt does well rolling over and scooting to EOB using rails but needs assist to lift to actual sitting  Transfers Overall transfer level: Needs assistance Equipment used: Rolling walker (2 wheeled) Transfers: Sit to/from Stand Sit to Stand: Min assist         General transfer comment: Pt very slow and deliberate getting to standing but does so with very little assist.  She does lean heavily with back of legs onto bed and has a small loss of balance when reaching R hand from bed rail to walker  Ambulation/Gait Ambulation/Gait assistance: Min assist Ambulation Distance (Feet): 6 Feet Assistive device: Rolling walker (2 wheeled)       General Gait Details: Pt with very slow, choppy, deliberate steps with  significant walker use and though she has no LOBs she has a general level of unsteadiness and hesistancy  Stairs            Wheelchair Mobility    Modified Rankin (Stroke Patients Only)       Balance                                             Pertinent Vitals/Pain Pain Assessment:  (unrated, she reports being very uncomfortable in mid back)    Home Living Family/patient expects to be discharged to:: Skilled nursing facility (pt would much rather go home if possible) Living Arrangements: Alone (daughter checks in 2-3X/wk)                    Prior Function Level of Independence: Independent with assistive device(s)         Comments: pt uses a walker in the home, cane on the 3 entry steps.  She is only out of the house every 6 weeks for the beauty shop but realtively independent in the home     Hand Dominance        Extremity/Trunk Assessment   Upper Extremity Assessment: Generalized weakness (age appropriate deficits)           Lower Extremity Assessment: Generalized weakness (pt has AROM  with LEs, slow/deliberate but grossly functional)         Communication   Communication: No difficulties  Cognition Arousal/Alertness: Awake/alert Behavior During Therapy: WFL for tasks assessed/performed (pt withdrawn, not an enthusiastic participant) Overall Cognitive Status: Within Functional Limits for tasks assessed                      General Comments      Exercises        Assessment/Plan    PT Assessment Patient needs continued PT services  PT Diagnosis Difficulty walking;Generalized weakness   PT Problem List Decreased strength;Decreased range of motion;Decreased balance;Decreased activity tolerance;Decreased mobility;Decreased knowledge of use of DME;Decreased safety awareness;Pain  PT Treatment Interventions DME instruction;Gait training;Functional mobility training;Stair training;Therapeutic  activities;Therapeutic exercise;Balance training;Neuromuscular re-education;Patient/family education   PT Goals (Current goals can be found in the Care Plan section) Acute Rehab PT Goals Patient Stated Goal: "I want to go home if possible" PT Goal Formulation: With patient/family Time For Goal Achievement: 07/17/15 Potential to Achieve Goals: Fair    Frequency Min 2X/week   Barriers to discharge   lives alone    Co-evaluation               End of Session Equipment Utilized During Treatment: Gait belt Activity Tolerance: Patient tolerated treatment well;Patient limited by fatigue Patient left: with chair alarm set;with call bell/phone within reach;with family/visitor present           Time: 1610-9604 PT Time Calculation (min) (ACUTE ONLY): 25 min   Charges:   PT Evaluation $PT Eval Moderate Complexity: 1 Procedure     PT G Codes:       Loran Senters, PT, DPT [phone]  Malachi Pro 07/03/2015, 1:13 PM

## 2015-07-03 NOTE — NC FL2 (Signed)
[place] MEDICAID FL2 LEVEL OF CARE SCREENING TOOL     IDENTIFICATION  Patient Name: Maria Walker Birthdate: 11/23/1926 Sex: female Admission Date (Current Location): 07/02/2015  [place] and [place] Number:  Chiropodist and Address:  [facility], [address] [place], [place] [address]      Provider Number: [id number]  Attending Physician Name and Address:  Wyatt Haste, MD  Relative Name and Phone Number:       Current Level of Care: Hospital Recommended Level of Care: Skilled Nursing Facility Prior Approval Number:    Date Approved/Denied:   PASRR Number: [id number]  Discharge Plan: SNF    Current Diagnoses: Patient Active Problem List   Diagnosis Date Noted  . Abnormal TSH 07/03/2015  . Hyponatremia 07/02/2015    Orientation RESPIRATION BLADDER Height & Weight     Self, Time, Situation, Place  Normal Incontinent Weight: 102 lb (46.267 kg) Height:   (154.9 cm)  BEHAVIORAL SYMPTOMS/MOOD NEUROLOGICAL BOWEL NUTRITION STATUS      Continent Diet (Regular Diet, Thin Liquids)  AMBULATORY STATUS COMMUNICATION OF NEEDS Skin   Limited Assist Verbally Normal                       Personal Care Assistance Level of Assistance  Bathing, Feeding, Dressing Bathing Assistance: Limited assistance Feeding assistance: Limited assistance Dressing Assistance: Limited assistance     Functional Limitations Info  Sight, Hearing, Speech Sight Info: Adequate Hearing Info: Adequate Speech Info: Adequate    SPECIAL CARE FACTORS FREQUENCY  PT (By licensed PT)                    Contractures Contractures Info: Not present    Additional Factors Info  Code Status, Allergies Code Status Info: DNR Allergies Info: Codeine           Current Medications (07/03/2015):  This is the current hospital active medication list Current Facility-Administered Medications  Medication Dose Route Frequency Provider Last Rate Last  Dose  . 0.9 %  sodium chloride infusion   Intravenous Continuous Auburn Bilberry, MD 75 mL/hr at 07/03/15 0523    . acetaminophen (TYLENOL) tablet 650 mg  650 mg Oral Q6H PRN Auburn Bilberry, MD   650 mg at 07/03/15 1053   Or  . acetaminophen (TYLENOL) suppository 650 mg  650 mg Rectal Q6H PRN Auburn Bilberry, MD      . aspirin EC tablet 81 mg  81 mg Oral Daily Auburn Bilberry, MD   81 mg at 07/03/15 0932  . cholecalciferol (VITAMIN D) tablet 1,000 Units  1,000 Units Oral Daily Wyatt Haste, MD   1,000 Units at 07/03/15 1146  . enoxaparin (LOVENOX) injection 30 mg  30 mg Subcutaneous Q24H Crystal G Scarpena, RPH   30 mg at 07/03/15 1751  . [START ON 07/04/2015] levothyroxine (SYNTHROID, LEVOTHROID) tablet 100 mcg  100 mcg Oral QAC breakfast Wyatt Haste, MD      . metoprolol tartrate (LOPRESSOR) tablet 12.5 mg  12.5 mg Oral BID Auburn Bilberry, MD   12.5 mg at 07/03/15 0932  . ondansetron (ZOFRAN) tablet 4 mg  4 mg Oral Q6H PRN Auburn Bilberry, MD       Or  . ondansetron (ZOFRAN) injection 4 mg  4 mg Intravenous Q6H PRN Auburn Bilberry, MD      . sodium chloride flush (NS) 0.9 % injection 3 mL  3 mL Intravenous Q12H Auburn Bilberry, MD  3 mL at 07/03/15 1146     Discharge Medications: Please see discharge summary for a list of discharge medications.  Relevant Imaging Results:  Relevant Lab Results:   Additional Information SSN:  [id number]  Dede Query, LCSW

## 2015-07-03 NOTE — Clinical Social Work Note (Signed)
Clinical Social Work Assessment  Patient Details  Name: Maria Walker MRN: [id number] Date of Birth: 1927/01/19  Date of referral:  07/03/15               Reason for consult:  Facility Placement                Permission sought to share information with:  Family Supports Permission granted to share information::  Yes, Verbal Permission Granted  Name::     Olin Hauser, daughter   Housing/Transportation Living arrangements for the past 2 months:  Single Family Home Source of Information:  Patient, Adult Children Patient Interpreter Needed:  None Criminal Activity/Legal Involvement Pertinent to Current Situation/Hospitalization:  No - Comment as needed Significant Relationships:  Adult Children, Neighbor, Other Family Members Lives with:  Self Do you feel safe going back to the place where you live?  Yes Need for family participation in patient care:  Yes (Comment)  Care giving concerns:  Pt lives alone.    Social Worker assessment / plan:  CSW met with pt and daughter to address consult for New SNF. CSW introduced herself and explained role of social work. CSW also explained the process of discharging to SNF as recommended by PT. However, pt may progress to be able to return home with home health as pt prefers. Pt's daughter is agreement with SNF, but feels that if pt could get herself up with her walker, pt would be safe to return home.  CSW initiated SNF search and will follow up with bed offers. CSW will continue to follow.   Employment status:  Retired Forensic scientist:  Medicare PT Recommendations:  [place] / Referral to community resources:  [place]  Patient/Family's Response to care:  Pt and pt's daughter were appreciative of CSW support.   Patient/Family's Understanding of and Emotional Response to Diagnosis, Current Treatment, and Prognosis:  Pt's daughter feels that pt would benefit from STR at SNF, however would be agreeable to  pt returning home with home health if pt were safe to do so.  Emotional Assessment Appearance:  Appears stated age Attitude/Demeanor/Rapport:  Other (Appropriate) Affect (typically observed):  Pleasant Orientation:  Oriented to Self, Oriented to Place, Oriented to  Time, Oriented to Situation Alcohol / Substance use:  Never Used Psych involvement (Current and /or in the community):  No (Comment)  Discharge Needs  Concerns to be addressed:  Adjustment to Illness Readmission within the last 30 days:  No Current discharge risk:  None Barriers to Discharge:  Continued Medical Work up   [employer], LCSW 07/03/2015, 8:10 PM

## 2015-07-04 LAB — BASIC METABOLIC PANEL
Anion gap: 4 — ABNORMAL LOW (ref 5–15)
BUN: 11 mg/dL (ref 6–20)
CALCIUM: 7.4 mg/dL — AB (ref 8.9–10.3)
CO2: 25 mmol/L (ref 22–32)
CREATININE: 0.68 mg/dL (ref 0.44–1.00)
Chloride: 100 mmol/L — ABNORMAL LOW (ref 101–111)
GFR calc Af Amer: >60 mL/min (ref >60)
Glucose, Bld: 93 mg/dL (ref 65–99)
POTASSIUM: 3.3 mmol/L — AB (ref 3.5–5.1)
SODIUM: 129 mmol/L — AB (ref 135–145)

## 2015-07-04 LAB — URINE CULTURE

## 2015-07-04 MED ORDER — ENOXAPARIN SODIUM 40 MG/0.4ML ~~LOC~~ SOLN
40.0000 mg | SUBCUTANEOUS | Status: DC
  Administered 2015-07-04: 18:00:00 40 mg via SUBCUTANEOUS
  Filled 2015-07-04: qty 0.4

## 2015-07-04 MED ORDER — CALCIUM CARBONATE ANTACID 500 MG PO CHEW
1.0000 | CHEWABLE_TABLET | Freq: Four times a day (QID) | ORAL | Status: DC | PRN
  Administered 2015-07-04: 200 mg via ORAL
  Filled 2015-07-04: qty 1

## 2015-07-04 NOTE — Progress Notes (Signed)
Anticoagulation Monitoring:  Patient is an 82 yo female with orders for Lovenox 30 mg subq q24h.  Dose adjusted initially for CrCl<30 mL/min.  Renal function improved this AM with est CrCl~37 mL/min.  Per anticoagulation policy, will transition patient to Lovenox 40 mg subq q24h based on CrCl>40 mL/min.   Hgb: 11.6, plts: 214  Will continue to monitor per policy.   Clarisa Schools, PharmD Clinical Pharmacist 07/04/2015

## 2015-07-04 NOTE — Progress Notes (Signed)
Pt. Was awake most of the night she did not go to sleep until about 0430 this morning. She is and was very pleasant, just lonely and wanted someone to stay in the room with her. Pt. Is sleeping at this time.

## 2015-07-04 NOTE — Progress Notes (Signed)
[facility] - [place] at [facility]   PATIENT NAME: Maria Walker    MR#:  [id number]  DATE OF BIRTH:  [birth year]  SUBJECTIVE:  Feels well this morning, no complaints Patient worked with physical therapy yesterday  REVIEW OF SYSTEMS:  CONSTITUTIONAL: No fever, fatigue or weakness.  EYES: No blurred or double vision.  EARS, NOSE, AND THROAT: No tinnitus or ear pain.  RESPIRATORY: No cough, shortness of breath, wheezing or hemoptysis.  CARDIOVASCULAR: No chest pain, orthopnea, edema.  GASTROINTESTINAL: No nausea, vomiting, diarrhea or abdominal pain.  GENITOURINARY: No dysuria, hematuria.  ENDOCRINE: No polyuria, nocturia,  HEMATOLOGY: No anemia, easy bruising or bleeding SKIN: No rash or lesion. MUSCULOSKELETAL: No joint pain or arthritis.   NEUROLOGIC: No tingling, numbness, weakness.  PSYCHIATRY: No anxiety or depression.   DRUG ALLERGIES:   Allergies  Allergen Reactions  . Codeine Other (See Comments)    Reaction:  Hallucinations     VITALS:  Blood pressure 111/61, pulse 70, temperature 98.4 F (36.9 C), temperature source Oral, resp. rate 17, height  (1.549 m), weight 46.267 kg (102 lb), SpO2 97 %.  PHYSICAL EXAMINATION:  VITAL SIGNS: Filed Vitals:   07/04/15 0515 07/04/15 0921  BP: 121/55 111/61  Pulse: 70   Temp: 98.4 F (36.9 C)   Resp: 17    GENERAL:82 y.o.female currently in no acute distress.  HEAD: Normocephalic, atraumatic.  EYES: Pupils equal, round, reactive to light. Extraocular muscles intact. No scleral icterus.  MOUTH: Moist mucosal membrane. Dentition intact. No abscess noted.  EAR, NOSE, THROAT: Clear without exudates. No external lesions.  NECK: Supple. No thyromegaly. No nodules. No JVD.  PULMONARY: Clear to ascultation, without wheeze rails or rhonci. No use of accessory muscles, Good respiratory effort. good air entry bilaterally CHEST: Nontender to palpation.  CARDIOVASCULAR: S1 and S2. Regular rate and rhythm.  No murmurs, rubs, or gallops. No edema. Pedal pulses 2+ bilaterally.  GASTROINTESTINAL: Soft, nontender, nondistended. No masses. Positive bowel sounds. No hepatosplenomegaly.  MUSCULOSKELETAL: No swelling, clubbing, or edema. Range of motion full in all extremities.  NEUROLOGIC: Cranial nerves II through XII are intact. No gross focal neurological deficits. Sensation intact. Reflexes intact.  SKIN: No ulceration, lesions, rashes, or cyanosis. Skin warm and dry. Turgor intact.  PSYCHIATRIC: Mood, affect within normal limits. The patient is awake, alert and oriented x 3. Insight, judgment intact.      LABORATORY PANEL:   CBC  Recent Labs Lab 07/03/15 0406  WBC 4.6  HGB 11.6*  HCT 34.6*  PLT 214   ------------------------------------------------------------------------------------------------------------------  Chemistries   Recent Labs Lab 07/02/15 1225  07/04/15 0426  NA 127*  < > 129*  K 3.8  < > 3.3*  CL 91*  < > 100*  CO2 24  < > 25  GLUCOSE 150*  < > 93  BUN 10  < > 11  CREATININE 0.87  < > 0.68  CALCIUM 8.8*  < > 7.4*  AST 73*  --   --   ALT 16  --   --   ALKPHOS 64  --   --   BILITOT 1.3*  --   --   < > = values in this interval not displayed. ------------------------------------------------------------------------------------------------------------------  Cardiac Enzymes  Recent Labs Lab 07/03/15 0406  TROPONINI 0.27*   ------------------------------------------------------------------------------------------------------------------  RADIOLOGY:  Dg Chest 2 View  07/02/2015  CLINICAL DATA:  Status post fall out of a chair today. Initial encounter. CHEST  2 VIEW COMPARISON:  Single view  of the chest 08/21/2010. CT thoracic spine 03/04/2006. FINDINGS: Lung volumes are somewhat low with mild left basilar atelectasis. No consolidative process, pneumothorax or effusion. Heart size is normal. Bones are osteopenic. Since the prior examination, since the prior  CT scan, the patient has suffered compression fractures of T6, T7, T8, T9. T10 and T11. There is also mild inferior endplate compression fracture of T10. IMPRESSION: No acute cardiopulmonary disease. Multiple thoracic spine compression fractures are new since 2007 but cannot be definitively characterized. T5 compression fracture is remote. Osteopenia. Electronically Signed   By: Drusilla Kanner M.D.   On: 07/02/2015 15:25   Ct Head Wo Contrast  07/02/2015  CLINICAL DATA:  Confusion. EXAM: CT HEAD WITHOUT CONTRAST TECHNIQUE: Contiguous axial images were obtained from the base of the skull through the vertex without intravenous contrast. COMPARISON:  None. FINDINGS: Periventricular white matter and corona radiata hypodensities favor chronic ischemic microvascular white matter disease. This is somewhat confluent in the left frontal periventricular white matter. Otherwise, the brainstem, cerebellum, cerebral peduncles, thalami, basal ganglia, basilar cisterns, and ventricular system appear within normal limits. No intracranial hemorrhage, mass lesion, or acute CVA. Chronic right posterior ethmoid and left sphenoid sinusitis. There is atherosclerotic calcification of the cavernous carotid arteries bilaterally. IMPRESSION: 1. No acute intracranial findings are identified. 2. Periventricular white matter and corona radiata hypodensities favor chronic ischemic microvascular white matter disease. 3. Chronic ethmoid and left sphenoid sinusitis. Electronically Signed   By: Gaylyn Rong M.D.   On: 07/02/2015 15:32    EKG:   Orders placed or performed during the hospital encounter of 07/02/15  . ED EKG  . ED EKG  . EKG 12-Lead  . EKG 12-Lead  . EKG 12-Lead  . EKG 12-Lead    ASSESSMENT AND PLAN:   82 year old Caucasian female history of hypothyroidism unspecified, essential hypertension presenting with weakness, found to have hyponatremia  1. Hyponatremia: Secondary to hypothyroidism, improved  2.  Hypothyroidism unspecified: Received IV Synthroid yesterday changed to oral Synthroid patient feels improved, per daughter patient actually has not taken Synthroid reliably for 2 months. We'll need follow-up TSH in 1 month 3. Vitamin D deficiency: Replace vitamin D 4. Elevated CK: Trending downward 5. Elevated troponin: No significant events trending downwards can discontinue to telemetry  6. Venous thromboembolism prophylactic: Lovenox  Disposition: Physical therapy recommended skilled nursing, social work involved However the patient able to walk with walker family feel comfortable taking her home   All the records are reviewed and case discussed with Care Management/Social Workerr. Management plans discussed with the patient, family and they are in agreement.  CODE STATUS: DO NOT RESUSCITATE  TOTAL TIME TAKING CARE OF THIS PATIENT: 28 minutes.   POSSIBLE D/C IN 1 DAYS, DEPENDING ON CLINICAL CONDITION.   Hower,  Mardi Mainland.D on 07/04/2015 at 10:46 AM  Between 7am to 6pm - Pager - [phone]  After 6pm: House Pager: - [phone]  [facility]  Office  [phone]  CC: Primary care physician; No primary care provider on file.

## 2015-07-05 DIAGNOSIS — Z9181 History of falling: Secondary | ICD-10-CM | POA: Diagnosis not present

## 2015-07-05 DIAGNOSIS — E039 Hypothyroidism, unspecified: Secondary | ICD-10-CM | POA: Diagnosis not present

## 2015-07-05 DIAGNOSIS — R2689 Other abnormalities of gait and mobility: Secondary | ICD-10-CM | POA: Diagnosis not present

## 2015-07-05 DIAGNOSIS — M6281 Muscle weakness (generalized): Secondary | ICD-10-CM | POA: Diagnosis not present

## 2015-07-05 DIAGNOSIS — R7989 Other specified abnormal findings of blood chemistry: Secondary | ICD-10-CM | POA: Diagnosis not present

## 2015-07-05 DIAGNOSIS — E871 Hypo-osmolality and hyponatremia: Secondary | ICD-10-CM | POA: Diagnosis not present

## 2015-07-05 DIAGNOSIS — G039 Meningitis, unspecified: Secondary | ICD-10-CM | POA: Diagnosis not present

## 2015-07-05 DIAGNOSIS — R531 Weakness: Secondary | ICD-10-CM | POA: Diagnosis not present

## 2015-07-05 DIAGNOSIS — E559 Vitamin D deficiency, unspecified: Secondary | ICD-10-CM | POA: Diagnosis not present

## 2015-07-05 LAB — BASIC METABOLIC PANEL
Anion gap: 2 — ABNORMAL LOW (ref 5–15)
BUN: 6 mg/dL (ref 6–20)
CHLORIDE: 105 mmol/L (ref 101–111)
CO2: 27 mmol/L (ref 22–32)
CREATININE: 0.71 mg/dL (ref 0.44–1.00)
Calcium: 7.6 mg/dL — ABNORMAL LOW (ref 8.9–10.3)
GFR calc Af Amer: >60 mL/min (ref >60)
GFR calc non Af Amer: >60 mL/min (ref >60)
GLUCOSE: 92 mg/dL (ref 65–99)
Potassium: 3.3 mmol/L — ABNORMAL LOW (ref 3.5–5.1)
SODIUM: 134 mmol/L — AB (ref 135–145)

## 2015-07-05 MED ORDER — CHOLECALCIFEROL 25 MCG (1000 UT) PO TABS: 1000.0000 [IU] | ORAL_TABLET | Freq: Every day | ORAL | Status: DC

## 2015-07-05 MED ORDER — LEVOTHYROXINE SODIUM 100 MCG PO TABS: 100.0000 ug | ORAL_TABLET | Freq: Every day | ORAL | Status: DC

## 2015-07-05 MED ORDER — ASPIRIN 81 MG PO TBEC: 81.0000 mg | DELAYED_RELEASE_TABLET | Freq: Every day | ORAL | Status: DC

## 2015-07-05 NOTE — Clinical Social Work Note (Signed)
Pt is ready for discharge today to [facility] for [employer] Pt and daughter are aware and agreeable to discharge plan. Facility has received discharge information and is ready to admit pt. RN will call report to room #316 at #[phone]. [facility] will provide transportation. CSW is signing off as no further needs identified.   Dede Query, MSW, LCSW Clinical Social Worker  [phone]

## 2015-07-05 NOTE — Discharge Instructions (Signed)
Repeat TSH with free T4 in 1 month with PCP

## 2015-07-05 NOTE — Progress Notes (Signed)
Report called to [facility].  EMS contacted for transport.  IV removed.  Pt ready for transport.  Family at bedside.  Orson Ape, RN

## 2015-07-05 NOTE — Progress Notes (Signed)
Talk to the patient's morning she feels that snf most appropriate discharge plan which I agree we'll start working on this process

## 2015-07-05 NOTE — Progress Notes (Signed)
Pt transported via EMS. 

## 2015-07-05 NOTE — Progress Notes (Signed)
Physical Therapy Treatment Patient Details Name: Maria Walker MRN: [id number] DOB: 10/29/1926 Today's Date: 07/05/2015    History of Present Illness Pt has been weak the last few dayshere with dehydration/hyponatremia and had a fall with mid thoracic skin tear    PT Comments    Pt agreed to participate with minimal encouragement.  Pt was able to get edge of bed with increased time and use of bed rails.  She was able to ambulate 10' with rolling walker around the bed to chair.  Pt with decreased gait speed and decreased step length.  Gait and transferred require +1 assist for safety.  Pt stated she would feel more comfortable going to short term rehab as she lives alone.  Pt is scheduled to discharge to [facility] today for rehab.  Follow Up Recommendations  SNF     Equipment Recommendations       Recommendations for Other Services       Precautions / Restrictions Restrictions Weight Bearing Restrictions: No    Mobility  Bed Mobility Overal bed mobility: Modified Independent (Slow but able to do it Ind with rails. States rails at home.) Bed Mobility: Supine to Sit     Supine to sit: Modified independent (Device/Increase time) (Ind with increased time and rails.  Has rails at home.)        Transfers Overall transfer level: Modified independent Equipment used: Rolling walker (2 wheeled) Transfers: Sit to/from Stand Sit to Stand: Min guard         General transfer comment: Slow, no lob's but high fall risk  Ambulation/Gait Ambulation/Gait assistance: Min guard Ambulation Distance (Feet): 10 Feet Assistive device: Rolling walker (2 wheeled) Gait Pattern/deviations: Step-through pattern;Decreased stride length     General Gait Details: slow   Stairs            Wheelchair Mobility    Modified Rankin (Stroke Patients Only)       Balance                                    Cognition Arousal/Alertness: Awake/alert Behavior During  Therapy: WFL for tasks assessed/performed Overall Cognitive Status: Within Functional Limits for tasks assessed                      Exercises      General Comments        Pertinent Vitals/Pain Pain Assessment: No/denies pain    Home Living                      Prior Function            PT Goals (current goals can now be found in the care plan section) Acute Rehab PT Goals Patient Stated Goal: "I think I need to go to Rehab but I need somebody to convince me to" Progress towards PT goals: Progressing toward goals    Frequency  Min 2X/week    PT Plan      Co-evaluation             End of Session Equipment Utilized During Treatment: Gait belt Activity Tolerance: Patient limited by fatigue Patient left: in chair;with call bell/phone within reach;with chair alarm set     Time: 1050-1110 PT Time Calculation (min) (ACUTE ONLY): 20 min  Charges:  $Gait Training: 8-22 mins  G Codes:     Danielle Dess, PTA 2015/07/10, 12:36 PM

## 2015-07-05 NOTE — Clinical Social Work Placement (Signed)
   CLINICAL SOCIAL WORK PLACEMENT  NOTE  Date:  07/05/2015  Patient Details  Name: Maria Walker MRN: [id number] Date of Birth: 09-29-1926  Clinical Social Work is seeking post-discharge placement for this patient at the Skilled  Nursing Facility level of care (*CSW will initial, date and re-position this form in  chart as items are completed):  Yes   Patient/family provided with [facility] Clinical Social Work Department's list of facilities offering this level of care within the geographic area requested by the patient (or if unable, by the patient's family).  Yes   Patient/family informed of their freedom to choose among providers that offer the needed level of care, that participate in Medicare, Medicaid or managed care program needed by the patient, have an available bed and are willing to accept the patient.  Yes   Patient/family informed of 's ownership interest in [facility] and [facility], as well as of the fact that they are under no obligation to receive care at these facilities.  PASRR submitted to EDS on       PASRR number received on       Existing PASRR number confirmed on 07/03/15     FL2 transmitted to all facilities in geographic area requested by pt/family on 07/03/15     FL2 transmitted to all facilities within larger geographic area on       Patient informed that his/her managed care company has contracts with or will negotiate with certain facilities, including the following:        Yes   Patient/family informed of bed offers received.  Patient chooses bed at [facility]     Physician recommends and patient chooses bed at  [facility])    Patient to be transferred to [facility] on 07/05/15.  Patient to be transferred to facility by [facility]      Patient family notified on 07/05/15 of transfer.  Name of family member notified:  Pam, daughter     PHYSICIAN       Additional Comment:     _______________________________________________ Dede Query, LCSW 07/05/2015, 2:20 PM

## 2015-07-05 NOTE — Care Management Important Message (Signed)
Important Message  Patient Details  Name: Maria Walker MRN: [id number] Date of Birth: 1926-06-24   Medicare Important Message Given:  Yes    Olegario Messier A Bernhard Koskinen 07/05/2015, 11:17 AM

## 2015-07-05 NOTE — Discharge Summary (Signed)
[facility] Physicians - [place] at [facility]   PATIENT NAME: Maria Walker    MR#:  [id number]  DATE OF BIRTH:  1926-08-20  DATE OF ADMISSION:  07/02/2015 ADMITTING PHYSICIAN: Enedina Finner, MD  DATE OF DISCHARGE: No discharge date for patient encounter.  PRIMARY CARE PHYSICIAN: No primary care provider on file.    ADMISSION DIAGNOSIS:  Hyponatremia [E87.1] Troponin level elevated [R79.89] Generalized weakness [R53.1]  DISCHARGE DIAGNOSIS:  Hypothyroidism unspecified Hyponatremia  SECONDARY DIAGNOSIS:   Past Medical History  Diagnosis Date  . Thyroid disease   . Apical lung scarring     HOSPITAL COURSE:  Maria Walker  is a 82 y.o. female admitted 07/02/2015 with chief complaint generalized weakness. Please see H&P performed by Dr. Allena Katz for further information. Originally she slid out of her chair at home and was unable to get off the floor for multiple hours. On presentation to the hospital for further workup and evaluation she was noted to have low sodium level (127) with elevated TSH (29). It turns out the patient has been noncompliant with her Synthroid for approximately 2 months. She received a dosage of IV Synthroid as well as started a higher level her symptoms markedly improved as did her sodium. She is evaluated by physical therapy who felt the patient benefit from skilled nursing facility rehabilitation which she was agreeable. She will need a repeat TSH in one month by her PCP to adjust Synthroid  DISCHARGE CONDITIONS:   Improved and stable  CONSULTS OBTAINED:     DRUG ALLERGIES:   Allergies  Allergen Reactions  . Codeine Other (See Comments)    Reaction:  Hallucinations     DISCHARGE MEDICATIONS:   Current Discharge Medication List    START taking these medications   Details  aspirin EC 81 MG EC tablet Take 1 tablet (81 mg total) by mouth daily. Qty: 30 tablet, Refills: 0    cholecalciferol 1000 units tablet Take 1 tablet (1,000 Units  total) by mouth daily. Qty: 30 tablet, Refills: 0      CONTINUE these medications which have CHANGED   Details  levothyroxine (SYNTHROID, LEVOTHROID) 100 MCG tablet Take 1 tablet (100 mcg total) by mouth daily before breakfast. Qty: 30 tablet, Refills: 0         DISCHARGE INSTRUCTIONS:  Follow-up TSH and free T4 PCP  DIET:  Regular diet  DISCHARGE CONDITION:  Stable  ACTIVITY:  Activity as tolerated  OXYGEN:  Home Oxygen: No.   Oxygen Delivery: room air  DISCHARGE LOCATION:  nursing home   If you experience worsening of your admission symptoms, develop shortness of breath, life threatening emergency, suicidal or homicidal thoughts you must seek medical attention immediately by calling 911 or calling your MD immediately  if symptoms less severe.  You Must read complete instructions/literature along with all the possible adverse reactions/side effects for all the Medicines you take and that have been prescribed to you. Take any new Medicines after you have completely understood and accpet all the possible adverse reactions/side effects.   Please note  You were cared for by a hospitalist during your hospital stay. If you have any questions about your discharge medications or the care you received while you were in the hospital after you are discharged, you can call the unit and asked to speak with the hospitalist on call if the hospitalist that took care of you is not available. Once you are discharged, your primary care physician will handle any further medical issues. Please  note that NO REFILLS for any discharge medications will be authorized once you are discharged, as it is imperative that you return to your primary care physician (or establish a relationship with a primary care physician if you do not have one) for your aftercare needs so that they can reassess your need for medications and monitor your lab values.    On the day of Discharge:   VITAL SIGNS:  Blood  pressure 149/75, pulse 72, temperature 98.2 F (36.8 C), temperature source Oral, resp. rate 20, height 5\' 1"  (1.549 m), weight 46.267 kg (102 lb), SpO2 94 %.  I/O:   Intake/Output Summary (Last 24 hours) at 07/05/15 1154 Last data filed at 07/04/15 1700  Gross per 24 hour  Intake    360 ml  Output    200 ml  Net    160 ml    PHYSICAL EXAMINATION:  GENERAL:  82 y.o.-year-old patient lying in the bed with no acute distress.  EYES: Pupils equal, round, reactive to light and accommodation. No scleral icterus. Extraocular muscles intact.  HEENT: Head atraumatic, normocephalic. Oropharynx and nasopharynx clear.  NECK:  Supple, no jugular venous distention. No thyroid enlargement, no tenderness.  LUNGS: Normal breath sounds bilaterally, no wheezing, rales,rhonchi or crepitation. No use of accessory muscles of respiration.  CARDIOVASCULAR: S1, S2 normal. No murmurs, rubs, or gallops.  ABDOMEN: Soft, non-tender, non-distended. Bowel sounds present. No organomegaly or mass.  EXTREMITIES: No pedal edema, cyanosis, or clubbing.  NEUROLOGIC: Cranial nerves II through XII are intact. Muscle strength 5/5 in all extremities. Sensation intact. Gait not checked.  PSYCHIATRIC: The patient is alert and oriented x 3.  SKIN: No obvious rash, lesion, or ulcer.   DATA REVIEW:   CBC  Recent Labs Lab 07/03/15 0406  WBC 4.6  HGB 11.6*  HCT 34.6*  PLT 214    Chemistries   Recent Labs Lab 07/02/15 1225  07/05/15 0743  NA 127*  < > 134*  K 3.8  < > 3.3*  CL 91*  < > 105  CO2 24  < > 27  GLUCOSE 150*  < > 92  BUN 10  < > 6  CREATININE 0.87  < > 0.71  CALCIUM 8.8*  < > 7.6*  AST 73*  --   --   ALT 16  --   --   ALKPHOS 64  --   --   BILITOT 1.3*  --   --   < > = values in this interval not displayed.  Cardiac Enzymes  Recent Labs Lab 07/03/15 0406  TROPONINI 0.27*    Microbiology Results  Results for orders placed or performed during the hospital encounter of 07/02/15  Urine  culture     Status: None   Collection Time: 07/02/15 12:25 PM  Result Value Ref Range Status   Specimen Description URINE, RANDOM  Final   Special Requests NONE  Final   Culture MULTIPLE SPECIES PRESENT, SUGGEST RECOLLECTION  Final   Report Status 07/04/2015 FINAL  Final    RADIOLOGY:  No results found.   Management plans discussed with the patient, family and they are in agreement.  CODE STATUS:     Code Status Orders        Start     Ordered   07/02/15 2037  Do not attempt resuscitation (DNR)   Continuous    Question Answer Comment  In the event of cardiac or respiratory ARREST Do not call a "code blue"   In the  event of cardiac or respiratory ARREST Do not perform Intubation, CPR, defibrillation or ACLS   In the event of cardiac or respiratory ARREST Use medication by any route, position, wound care, and other measures to relive pain and suffering. May use oxygen, suction and manual treatment of airway obstruction as needed for comfort.      07/02/15 2036    Code Status History    Date Active Date Inactive Code Status Order ID Comments User Context   07/02/2015  4:21 PM 07/02/2015  8:36 PM Full Code [id number]  Auburn Bilberry, MD ED      TOTAL TIME TAKING CARE OF THIS PATIENT: 28 minutes.    Hower,  Mardi Mainland.D on 07/05/2015 at 11:54 AM  Between 7am to 6pm - Pager - [phone]  After 6pm go to [email or web address] - password [email or web address] [facility]  [facility]  Office  [phone]  CC: Primary care physician; No primary care provider on file.

## 2015-07-06 DIAGNOSIS — M6281 Muscle weakness (generalized): Secondary | ICD-10-CM

## 2015-07-06 DIAGNOSIS — E871 Hypo-osmolality and hyponatremia: Secondary | ICD-10-CM

## 2015-07-06 DIAGNOSIS — G039 Meningitis, unspecified: Secondary | ICD-10-CM

## 2015-07-16 DIAGNOSIS — Z9181 History of falling: Secondary | ICD-10-CM | POA: Diagnosis not present

## 2015-07-16 DIAGNOSIS — R262 Difficulty in walking, not elsewhere classified: Secondary | ICD-10-CM | POA: Diagnosis not present

## 2015-07-16 DIAGNOSIS — R531 Weakness: Secondary | ICD-10-CM | POA: Diagnosis not present

## 2015-07-16 DIAGNOSIS — J984 Other disorders of lung: Secondary | ICD-10-CM | POA: Diagnosis not present

## 2015-07-16 DIAGNOSIS — E039 Hypothyroidism, unspecified: Secondary | ICD-10-CM | POA: Diagnosis not present

## 2015-07-19 DIAGNOSIS — R531 Weakness: Secondary | ICD-10-CM | POA: Diagnosis not present

## 2015-07-19 DIAGNOSIS — Z9181 History of falling: Secondary | ICD-10-CM | POA: Diagnosis not present

## 2015-07-19 DIAGNOSIS — J984 Other disorders of lung: Secondary | ICD-10-CM | POA: Diagnosis not present

## 2015-07-19 DIAGNOSIS — E039 Hypothyroidism, unspecified: Secondary | ICD-10-CM | POA: Diagnosis not present

## 2015-07-19 DIAGNOSIS — R262 Difficulty in walking, not elsewhere classified: Secondary | ICD-10-CM | POA: Diagnosis not present

## 2015-07-20 ENCOUNTER — Encounter: Payer: Self-pay | Admitting: Internal Medicine

## 2015-07-20 DIAGNOSIS — J984 Other disorders of lung: Secondary | ICD-10-CM | POA: Insufficient documentation

## 2015-07-20 DIAGNOSIS — R531 Weakness: Secondary | ICD-10-CM | POA: Diagnosis not present

## 2015-07-20 DIAGNOSIS — E039 Hypothyroidism, unspecified: Secondary | ICD-10-CM | POA: Diagnosis not present

## 2015-07-20 DIAGNOSIS — R262 Difficulty in walking, not elsewhere classified: Secondary | ICD-10-CM | POA: Diagnosis not present

## 2015-07-20 DIAGNOSIS — Z9181 History of falling: Secondary | ICD-10-CM | POA: Diagnosis not present

## 2015-07-22 DIAGNOSIS — R262 Difficulty in walking, not elsewhere classified: Secondary | ICD-10-CM | POA: Diagnosis not present

## 2015-07-22 DIAGNOSIS — E039 Hypothyroidism, unspecified: Secondary | ICD-10-CM | POA: Diagnosis not present

## 2015-07-22 DIAGNOSIS — J984 Other disorders of lung: Secondary | ICD-10-CM | POA: Diagnosis not present

## 2015-07-22 DIAGNOSIS — R531 Weakness: Secondary | ICD-10-CM | POA: Diagnosis not present

## 2015-07-22 DIAGNOSIS — Z9181 History of falling: Secondary | ICD-10-CM | POA: Diagnosis not present

## 2015-07-27 DIAGNOSIS — R262 Difficulty in walking, not elsewhere classified: Secondary | ICD-10-CM | POA: Diagnosis not present

## 2015-07-27 DIAGNOSIS — E039 Hypothyroidism, unspecified: Secondary | ICD-10-CM | POA: Diagnosis not present

## 2015-07-27 DIAGNOSIS — J984 Other disorders of lung: Secondary | ICD-10-CM | POA: Diagnosis not present

## 2015-07-27 DIAGNOSIS — Z9181 History of falling: Secondary | ICD-10-CM | POA: Diagnosis not present

## 2015-07-27 DIAGNOSIS — R531 Weakness: Secondary | ICD-10-CM | POA: Diagnosis not present

## 2015-07-28 ENCOUNTER — Ambulatory Visit (INDEPENDENT_AMBULATORY_CARE_PROVIDER_SITE_OTHER): Payer: Medicare Other | Admitting: Internal Medicine

## 2015-07-28 ENCOUNTER — Encounter: Payer: Self-pay | Admitting: Internal Medicine

## 2015-07-28 VITALS — BP 148/84 | HR 102 | Temp 97.5°F | Wt 99.8 lb

## 2015-07-28 DIAGNOSIS — E039 Hypothyroidism, unspecified: Secondary | ICD-10-CM | POA: Diagnosis not present

## 2015-07-28 DIAGNOSIS — E441 Mild protein-calorie malnutrition: Secondary | ICD-10-CM | POA: Insufficient documentation

## 2015-07-28 DIAGNOSIS — E871 Hypo-osmolality and hyponatremia: Secondary | ICD-10-CM

## 2015-07-28 DIAGNOSIS — J984 Other disorders of lung: Secondary | ICD-10-CM

## 2015-07-28 LAB — RENAL FUNCTION PANEL
ALBUMIN: 4 g/dL (ref 3.5–5.2)
BUN: 10 mg/dL (ref 6–23)
CHLORIDE: 94 meq/L — AB (ref 96–112)
CO2: 28 meq/L (ref 19–32)
Calcium: 9.1 mg/dL (ref 8.4–10.5)
Creatinine, Ser: 0.64 mg/dL (ref 0.40–1.20)
GFR: 93.34 mL/min (ref >60.00)
Glucose, Bld: 96 mg/dL (ref 70–99)
Phosphorus: 3.9 mg/dL (ref 2.3–4.6)
Potassium: 3.9 mEq/L (ref 3.5–5.1)
Sodium: 131 mEq/L — ABNORMAL LOW (ref 135–145)

## 2015-07-28 LAB — T4, FREE: Free T4: 1.36 ng/dL (ref 0.60–1.60)

## 2015-07-28 LAB — TSH: TSH: 1.84 u[IU]/mL (ref 0.35–4.50)

## 2015-07-28 NOTE — Progress Notes (Signed)
Subjective:    Patient ID: Maria Walker, female    DOB: 1926/10/10, 82 y.o.   MRN: [id number]  HPI Here with daughter  [facility]  Lives in her own home Daughter checks on her every other day at least Daughter brings prepared foods/[employer], etc Washes dishes, did laundry in past--- daughter doing [place] Now has lif[place]--- okay getting there Showers with shower chair---has permanent shower bar Done with OT but still working with PT  Current Outpatient Prescriptions on File Prior to Visit  Medication Sig Dispense Refill  . levothyroxine (SYNTHROID, LEVOTHROID) 100 MCG tablet Take 1 tablet (100 mcg total) by mouth daily before breakfast. 30 tablet 0  . Multiple Vitamins-Minerals (CENTRUM SILVER PO) Take 1 tablet by mouth daily.    Bertram Gala. Polyethyl Glycol-Propyl Glycol (SYSTANE) 0.4-0.3 % GEL ophthalmic gel Place 1 application into both eyes 3 (three) times daily as needed.     No current facility-administered medications on file prior to visit.    Allergies  Allergen Reactions  . Codeine Other (See Comments)    Reaction:  Hallucinations     Past Medical History  Diagnosis Date  . Apical lung scarring   . Hypothyroidism     after goiter surgery  . Chronic lung disease     from past pneumonia  . Hyponatremia     with cessation of thyroid replacement    Past Surgical History  Procedure Laterality Date  . Hip surgery  2014    Left partial hip  . Thyroidectomy, partial  1948    goiter  . Appendectomy      Family History  Problem Relation Age of Onset  . Hypertension           Social History   Social History  . Marital Status: Widowed    Spouse Name: N/A  . Number of Children: 3  . Years of Education: N/A   Occupational History  . Office work     Retired   Social History Main Topics  . Smoking status: Never Smoker   . Smokeless tobacco: Not on file  . Alcohol Use: No  . Drug Use: No  . Sexual Activity: Not on  file   Other Topics Concern  . Not on file   Social History Narrative   Widowed 1989      No living will   Daughter Elita Quickam should be her health care POA   Has DNR already   No tube feeds if cognitively unaware   Review of Systems Appetite not great--but her usual Weight was always 110#--- has lost in past 2 years or so Ensure in the past--gave her diarrhea. Has supplement with more protein from health food store Sleeps okay occ muscle spasm in back    Objective:   Physical Exam  Constitutional: No distress.  Neck: Normal range of motion. Neck supple. No thyromegaly present.  Cardiovascular: Regular rhythm and normal heart sounds.  Exam reveals no gallop.   No murmur heard. Pulmonary/Chest: Effort normal. No respiratory distress. She has no wheezes. She has no rales.  Decreased breath sounds but clear  Abdominal: Soft. There is no tenderness.  Musculoskeletal: She exhibits no edema.  Lymphadenopathy:    She has no cervical adenopathy.  Skin:  Ulcer over mid thoracic vertebrae has scab now (discussed using lamb's wool on chair to reduce abrasion)  Psychiatric: She has a normal mood and affect. Her behavior is normal.  Assessment & Plan:

## 2015-07-28 NOTE — Assessment & Plan Note (Signed)
Chronic scarring Does have DOE but no Rx

## 2015-07-28 NOTE — Progress Notes (Signed)
Pre visit review using our clinic review tool, if applicable. No additional management support is needed unless otherwise documented below in the visit note. 

## 2015-07-28 NOTE — Assessment & Plan Note (Signed)
Daughter does give supplements Should change from the [employer] Will monitor

## 2015-07-28 NOTE — Assessment & Plan Note (Signed)
Hospitalized after stopping the med Now back on and seems to be euthyroid (although heart rate up slightly) Will check labs today

## 2015-07-28 NOTE — Assessment & Plan Note (Signed)
While in hospital Will recheck

## 2015-07-29 DIAGNOSIS — R531 Weakness: Secondary | ICD-10-CM | POA: Diagnosis not present

## 2015-07-29 DIAGNOSIS — R262 Difficulty in walking, not elsewhere classified: Secondary | ICD-10-CM | POA: Diagnosis not present

## 2015-07-29 DIAGNOSIS — J984 Other disorders of lung: Secondary | ICD-10-CM | POA: Diagnosis not present

## 2015-07-29 DIAGNOSIS — E039 Hypothyroidism, unspecified: Secondary | ICD-10-CM | POA: Diagnosis not present

## 2015-07-29 DIAGNOSIS — Z9181 History of falling: Secondary | ICD-10-CM | POA: Diagnosis not present

## 2015-08-03 DIAGNOSIS — R531 Weakness: Secondary | ICD-10-CM | POA: Diagnosis not present

## 2015-08-03 DIAGNOSIS — E039 Hypothyroidism, unspecified: Secondary | ICD-10-CM | POA: Diagnosis not present

## 2015-08-03 DIAGNOSIS — J984 Other disorders of lung: Secondary | ICD-10-CM | POA: Diagnosis not present

## 2015-08-03 DIAGNOSIS — Z9181 History of falling: Secondary | ICD-10-CM | POA: Diagnosis not present

## 2015-08-03 DIAGNOSIS — R262 Difficulty in walking, not elsewhere classified: Secondary | ICD-10-CM | POA: Diagnosis not present

## 2015-08-05 DIAGNOSIS — R531 Weakness: Secondary | ICD-10-CM | POA: Diagnosis not present

## 2015-08-05 DIAGNOSIS — Z9181 History of falling: Secondary | ICD-10-CM | POA: Diagnosis not present

## 2015-08-05 DIAGNOSIS — R262 Difficulty in walking, not elsewhere classified: Secondary | ICD-10-CM | POA: Diagnosis not present

## 2015-08-05 DIAGNOSIS — J984 Other disorders of lung: Secondary | ICD-10-CM | POA: Diagnosis not present

## 2015-08-05 DIAGNOSIS — E039 Hypothyroidism, unspecified: Secondary | ICD-10-CM | POA: Diagnosis not present

## 2015-08-10 DIAGNOSIS — R262 Difficulty in walking, not elsewhere classified: Secondary | ICD-10-CM | POA: Diagnosis not present

## 2015-08-10 DIAGNOSIS — J984 Other disorders of lung: Secondary | ICD-10-CM | POA: Diagnosis not present

## 2015-08-10 DIAGNOSIS — Z9181 History of falling: Secondary | ICD-10-CM | POA: Diagnosis not present

## 2015-08-10 DIAGNOSIS — E039 Hypothyroidism, unspecified: Secondary | ICD-10-CM | POA: Diagnosis not present

## 2015-08-10 DIAGNOSIS — R531 Weakness: Secondary | ICD-10-CM | POA: Diagnosis not present

## 2015-08-11 ENCOUNTER — Telehealth: Payer: Self-pay

## 2015-08-11 MED ORDER — LEVOTHYROXINE SODIUM 100 MCG PO TABS: 100.0000 ug | ORAL_TABLET | Freq: Every day | ORAL | Status: DC

## 2015-08-11 NOTE — Telephone Encounter (Signed)
Rinaldo Cloudamela Daughter [phone]  Rinaldo Cloudamela dropped off disability Parking Placard for Rubin Payordith to be filled out. Call when ready. Placed in RX box up front.

## 2015-08-11 NOTE — Telephone Encounter (Signed)
Rinaldo Cloudamela Daughter [phone]  [facility] [id number]  Rinaldo Cloudamela stop by to bring some paperwork for Rubin Payordith and she said that in her note in my chart it said for Rubin Payordith to state on the same dosage of levothyroxine (SYNTHROID, LEVOTHROID) 100 MCG tablet, but she does not have prescription, she only had what they gave her in the hospital,  Could you send prescription in.

## 2015-08-11 NOTE — Telephone Encounter (Signed)
Form in Dr Karle StarchLetvak's InBox on his desk for signature

## 2015-08-11 NOTE — Telephone Encounter (Signed)
I spoke with Rinaldo Cloudamela (pts POA) and she said pt still had med from rx given from hospital dr but per Dr Carlton AdamLetvaks mychart note on recent labs pt is to continue this dosage. Pam request 90 day rx and I advised sending electronically to [facility] now. Pam appreciative and voiced understanding.

## 2015-08-12 DIAGNOSIS — J984 Other disorders of lung: Secondary | ICD-10-CM | POA: Diagnosis not present

## 2015-08-12 DIAGNOSIS — E039 Hypothyroidism, unspecified: Secondary | ICD-10-CM | POA: Diagnosis not present

## 2015-08-12 DIAGNOSIS — Z9181 History of falling: Secondary | ICD-10-CM | POA: Diagnosis not present

## 2015-08-12 DIAGNOSIS — R262 Difficulty in walking, not elsewhere classified: Secondary | ICD-10-CM | POA: Diagnosis not present

## 2015-08-12 DIAGNOSIS — R531 Weakness: Secondary | ICD-10-CM | POA: Diagnosis not present

## 2015-08-12 NOTE — Telephone Encounter (Signed)
I left a message on Maria Walker's voice mail. Form is ready for pick up and no charge.

## 2015-08-12 NOTE — Telephone Encounter (Signed)
Form done No charge 

## 2015-12-16 ENCOUNTER — Emergency Department: Payer: Medicare Other

## 2015-12-16 ENCOUNTER — Emergency Department
Admission: EM | Admit: 2015-12-16 | Discharge: 2015-12-17 | Disposition: A | Discharge status: Home / Self Care | Payer: Medicare Other | Attending: Emergency Medicine | Admitting: Emergency Medicine

## 2015-12-16 ENCOUNTER — Encounter: Payer: Self-pay | Admitting: Emergency Medicine

## 2015-12-16 DIAGNOSIS — Y999 Unspecified external cause status: Secondary | ICD-10-CM | POA: Diagnosis not present

## 2015-12-16 DIAGNOSIS — S96912A Strain of unspecified muscle and tendon at ankle and foot level, left foot, initial encounter: Secondary | ICD-10-CM | POA: Insufficient documentation

## 2015-12-16 DIAGNOSIS — S96812A Strain of other specified muscles and tendons at ankle and foot level, left foot, initial encounter: Secondary | ICD-10-CM | POA: Diagnosis not present

## 2015-12-16 DIAGNOSIS — M25571 Pain in right ankle and joints of right foot: Secondary | ICD-10-CM | POA: Diagnosis not present

## 2015-12-16 DIAGNOSIS — E039 Hypothyroidism, unspecified: Secondary | ICD-10-CM | POA: Insufficient documentation

## 2015-12-16 DIAGNOSIS — W1839XA Other fall on same level, initial encounter: Secondary | ICD-10-CM | POA: Diagnosis not present

## 2015-12-16 DIAGNOSIS — M79632 Pain in left forearm: Secondary | ICD-10-CM | POA: Diagnosis present

## 2015-12-16 DIAGNOSIS — S99911A Unspecified injury of right ankle, initial encounter: Secondary | ICD-10-CM | POA: Diagnosis not present

## 2015-12-16 DIAGNOSIS — S52692A Other fracture of lower end of left ulna, initial encounter for closed fracture: Secondary | ICD-10-CM | POA: Insufficient documentation

## 2015-12-16 DIAGNOSIS — W19XXXA Unspecified fall, initial encounter: Secondary | ICD-10-CM

## 2015-12-16 DIAGNOSIS — R262 Difficulty in walking, not elsewhere classified: Secondary | ICD-10-CM

## 2015-12-16 DIAGNOSIS — Y9389 Activity, other specified: Secondary | ICD-10-CM | POA: Diagnosis not present

## 2015-12-16 DIAGNOSIS — Y92009 Unspecified place in unspecified non-institutional (private) residence as the place of occurrence of the external cause: Secondary | ICD-10-CM

## 2015-12-16 DIAGNOSIS — Y92002 Bathroom of unspecified non-institutional (private) residence single-family (private) house as the place of occurrence of the external cause: Secondary | ICD-10-CM | POA: Diagnosis not present

## 2015-12-16 DIAGNOSIS — I10 Essential (primary) hypertension: Secondary | ICD-10-CM | POA: Diagnosis not present

## 2015-12-16 DIAGNOSIS — S52202A Unspecified fracture of shaft of left ulna, initial encounter for closed fracture: Secondary | ICD-10-CM

## 2015-12-16 DIAGNOSIS — I160 Hypertensive urgency: Secondary | ICD-10-CM | POA: Diagnosis not present

## 2015-12-16 LAB — COMPREHENSIVE METABOLIC PANEL
ALT: 16 U/L (ref 14–54)
AST: 33 U/L (ref 15–41)
Albumin: 3.9 g/dL (ref 3.5–5.0)
Alkaline Phosphatase: 66 U/L (ref 38–126)
Anion gap: 8 (ref 5–15)
BUN: 8 mg/dL (ref 6–20)
CHLORIDE: 95 mmol/L — AB (ref 101–111)
CO2: 28 mmol/L (ref 22–32)
CREATININE: 0.6 mg/dL (ref 0.44–1.00)
Calcium: 8.6 mg/dL — ABNORMAL LOW (ref 8.9–10.3)
GFR calc Af Amer: >60 mL/min (ref >60)
GFR calc non Af Amer: >60 mL/min (ref >60)
Glucose, Bld: 149 mg/dL — ABNORMAL HIGH (ref 65–99)
POTASSIUM: 3.3 mmol/L — AB (ref 3.5–5.1)
SODIUM: 131 mmol/L — AB (ref 135–145)
Total Bilirubin: 0.8 mg/dL (ref 0.3–1.2)
Total Protein: 7.7 g/dL (ref 6.5–8.1)

## 2015-12-16 LAB — CBC WITH DIFFERENTIAL/PLATELET
BASOS ABS: 0 10*3/uL (ref 0–0.1)
BASOS PCT: 0 %
EOS ABS: 0 10*3/uL (ref 0–0.7)
EOS PCT: 0 %
HCT: 35.1 % (ref 35.0–47.0)
Hemoglobin: 11.8 g/dL — ABNORMAL LOW (ref 12.0–16.0)
Lymphocytes Relative: 7 %
Lymphs Abs: 0.5 10*3/uL — ABNORMAL LOW (ref 1.0–3.6)
MCH: 29.1 pg (ref 26.0–34.0)
MCHC: 33.6 g/dL (ref 32.0–36.0)
MCV: 86.6 fL (ref 80.0–100.0)
Monocytes Absolute: 0.7 10*3/uL (ref 0.2–0.9)
Monocytes Relative: 11 %
Neutro Abs: 5.8 10*3/uL (ref 1.4–6.5)
Neutrophils Relative %: 82 %
PLATELETS: 197 10*3/uL (ref 150–440)
RBC: 4.06 MIL/uL (ref 3.80–5.20)
RDW: 14.6 % — AB (ref 11.5–14.5)
WBC: 7 10*3/uL (ref 3.6–11.0)

## 2015-12-16 LAB — URINALYSIS COMPLETE WITH MICROSCOPIC (ARMC ONLY)
BACTERIA UA: NONE SEEN
Bilirubin Urine: NEGATIVE
Glucose, UA: NEGATIVE mg/dL
Hgb urine dipstick: NEGATIVE
KETONES UR: NEGATIVE mg/dL
Leukocytes, UA: NEGATIVE
NITRITE: NEGATIVE
PH: 7 (ref 5.0–8.0)
PROTEIN: NEGATIVE mg/dL
RBC / HPF: NONE SEEN RBC/hpf (ref 0–5)
SPECIFIC GRAVITY, URINE: 1.006 (ref 1.005–1.030)
Squamous Epithelial / LPF: NONE SEEN

## 2015-12-16 MED ORDER — LEVOTHYROXINE SODIUM 50 MCG PO TABS
100.0000 ug | ORAL_TABLET | Freq: Every day | ORAL | Status: DC
  Administered 2015-12-17: 100 ug via ORAL
  Filled 2015-12-16: qty 2

## 2015-12-16 MED ORDER — ADULT MULTIVITAMIN W/MINERALS CH
ORAL_TABLET | Freq: Every day | ORAL | Status: DC
  Administered 2015-12-17: 1 via ORAL
  Filled 2015-12-16: qty 1

## 2015-12-16 MED ORDER — POLYETHYL GLYCOL-PROPYL GLYCOL 0.4-0.3 % OP GEL: 1.0000 "application " | Freq: Three times a day (TID) | OPHTHALMIC | Status: DC | PRN

## 2015-12-16 NOTE — ED Triage Notes (Signed)
Per EMS pt fell at home.  Called for transport to [facility].  After waiting decided to come in to the emergency dept to be checked.  No LOC, dizziness, didn't hit her head, on no blood thinners. Pt experienced a fall at home about 2 pm today.  Pt lives alone.  Daughter happened to be present in the home at the time of the fall. EMS BP 110/100.  Pt states she didn't fall completely down, sort of "sat down" against the tv cabinet.  No shortening or external rotation of right leg .  Left wrist no deformity but bruising noted.

## 2015-12-16 NOTE — ED Notes (Signed)
Attempted to walk patient with her Maria Walker. Pt. could stand with assistance and move Famous [place] forward but was not able to move her legs.

## 2015-12-16 NOTE — ED Provider Notes (Signed)
[facility] Emergency Department Provider Note  ____________________________________________   First MD Initiated Contact with Patient 12/16/15 2012     (approximate)  I have reviewed the triage vital signs and the nursing notes.   HISTORY  Chief Complaint Fall    HPI Maria Walker is a 82 y.o. female who presents for evaluation of pain in her left forearm and left ankle after a mechanical fall.  She was reportedly watching TV and it was on her way to the bathroom when she turned and fell.  She caught her weight on her left arm.  She did not strike her head and did not lose consciousness.Initially she went to the urgent care but then decided to come to the emergency department for evaluation.  She states that her pain is mild, constant, worse if she pushes on the left forearm.  Her ankle pain is mild and worse with ambulation.  She has a rolling walker at home that she uses for ambulation.  She denies numbness and tingling in her arm nor hand.  She is able to move all her fingers.  Onset was acute.    She denies CP, SOB, N/V/D, dysuria.   Past Medical History:  Diagnosis Date  . Apical lung scarring   . Chronic lung disease    from past pneumonia  . Hyponatremia    with cessation of thyroid replacement  . Hypothyroidism    after goiter surgery    Patient Active Problem List   Diagnosis Date Noted  . Mild malnutrition (HCC) 07/28/2015  . Hypothyroidism   . Chronic lung disease   . Abnormal TSH 07/03/2015  . Hyponatremia 07/02/2015    Past Surgical History:  Procedure Laterality Date  . APPENDECTOMY    . HIP SURGERY  2014   Left partial hip  . THYROIDECTOMY, PARTIAL  1948   goiter    Prior to Admission medications   Medication Sig Start Date End Date Taking? Authorizing Provider  levothyroxine (SYNTHROID, LEVOTHROID) 100 MCG tablet Take 1 tablet (100 mcg total) by mouth daily before breakfast. 08/11/15   Karie Schwalbe, MD  Multiple  Vitamins-Minerals (CENTRUM SILVER PO) Take 1 tablet by mouth daily.    Historical Provider, MD  Polyethyl Glycol-Propyl Glycol (SYSTANE) 0.4-0.3 % GEL ophthalmic gel Place 1 application into both eyes 3 (three) times daily as needed.    Historical Provider, MD    Allergies Codeine  Family History  Problem Relation Age of Onset  . Hypertension           Social History Social History  Substance Use Topics  . Smoking status: Never Smoker  . Smokeless tobacco: Not on file  . Alcohol use No    Review of Systems Constitutional: No fever/chills Eyes: No visual changes. ENT: No sore throat. Cardiovascular: Denies chest pain. Respiratory: Denies shortness of breath. Gastrointestinal: No abdominal pain.  No nausea, no vomiting.  No diarrhea.  No constipation. Genitourinary: Negative for dysuria. Musculoskeletal: Pain in distal left forearm Skin: Negative for rash. Neurological: Negative for headaches, focal weakness or numbness.  10-point ROS otherwise negative.  ____________________________________________   PHYSICAL EXAM:  VITAL SIGNS: ED Triage Vitals  Enc Vitals Group     BP 12/16/15 1822 (!) 209/98     Pulse Rate 12/16/15 1822 (!) 123     Resp 12/16/15 1822 18     Temp 12/16/15 1833 98.2 F (36.8 C)     Temp Source 12/16/15 1833 Oral  SpO2 12/16/15 1822 96 %     Weight 12/16/15 1822 103 lb (46.7 kg)     Height 12/16/15 1822 5\' 4"  (1.626 m)     Head Circumference --      Peak Flow --      Pain Score 12/16/15 1823 6     Pain Loc --      Pain Edu? --      Excl. in GC? --     Constitutional: Alert and oriented. Well appearing and in no acute distress. Eyes: Conjunctivae are normal. PERRL. EOMI. Head: Atraumatic. Nose: No congestion/rhinnorhea. Mouth/Throat: Mucous membranes are moist.  Oropharynx non-erythematous. Neck: No stridor.  No meningeal signs.  No cervical spine tenderness to palpation. Cardiovascular: Normal rate, regular rhythm. Good peripheral  circulation. Grossly normal heart sounds.   Respiratory: Normal respiratory effort.  No retractions. Lungs CTAB. Gastrointestinal: Soft and nontender. No distention.  Musculoskeletal: Bruising and minimal swelling to ulnar aspect of distal left forearm.  N/V intact distally.  No gross  Deformity.  Tender to palpation (mild). Neurologic:  Normal speech and language. No gross focal neurologic deficits are appreciated.  Skin:  Skin is warm, dry and intact. No rash noted. Psychiatric: Mood and affect are normal. Speech and behavior are normal.  ____________________________________________   LABS (all labs ordered are listed, but only abnormal results are displayed)  Labs Reviewed  CBC WITH DIFFERENTIAL/PLATELET - Abnormal; Notable for the following:       Result Value   Hemoglobin 11.8 (*)    RDW 14.6 (*)    Lymphs Abs 0.5 (*)    All other components within normal limits  COMPREHENSIVE METABOLIC PANEL - Abnormal; Notable for the following:    Sodium 131 (*)    Potassium 3.3 (*)    Chloride 95 (*)    Glucose, Bld 149 (*)    Calcium 8.6 (*)    All other components within normal limits  URINALYSIS COMPLETEWITH MICROSCOPIC ([facility] ONLY) - Abnormal; Notable for the following:    Color, Urine STRAW (*)    APPearance HAZY (*)    All other components within normal limits  URINE CULTURE   ____________________________________________  EKG  None ____________________________________________  RADIOLOGY   Dg Forearm Left  Result Date: 12/16/2015 CLINICAL DATA:  Fall from standing at home this evening, c/o left wrist, left elbow and right ankle pain EXAM: LEFT FOREARM - 2 VIEW COMPARISON:  None. FINDINGS: There is a nondisplaced fracture of the distal ulna. No other fractures. The wrist and elbow joints are normally aligned. Bones are demineralized. There is ulnar sided wrist soft tissue swelling. IMPRESSION: Nondisplaced fracture of the distal ulna at the wrist joint. No dislocation.  Electronically Signed   By: Amie Portland M.D.   On: 12/16/2015 19:09  Dg Wrist Complete Left  Result Date: 12/16/2015 CLINICAL DATA:  Left arm pain secondary to a fall at home tonight. EXAM: LEFT WRIST - COMPLETE 3+ VIEW COMPARISON:  None. FINDINGS: There is a minimally displaced fracture through the medial aspect of the distal ulna. No other acute abnormality. 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 Diffuse osteopenia. IMPRESSION: Minimally displaced fracture of the distal ulna near the base of the ulnar styloid. Electronically Signed   By: Francene Boyers M.D.   On: 12/16/2015 19:11  Dg Ankle Complete Right  Result Date: 12/16/2015 CLINICAL DATA:  Fall from standing at home this evening, c/o left wrist, left elbow and right ankle pain EXAM: RIGHT ANKLE - COMPLETE 3+  VIEW COMPARISON:  None. FINDINGS: No fracture. Ankle mortise is normally spaced and aligned. The bones are diffusely demineralized. There is mild diffuse soft tissue edema which predominates laterally. IMPRESSION: No fracture or dislocation. Electronically Signed   By: Amie Portland M.D.   On: 12/16/2015 19:10   ____________________________________________   PROCEDURES  Procedure(s) performed:   .Splint Application Date/Time: 12/16/2015 11:27 PM Performed by: Loleta Rose Authorized by: Loleta Rose   Consent:    Consent obtained:  Verbal   Consent given by:  Patient Pre-procedure details:    Sensation:  Normal Procedure details:    Laterality:  Left   Location:  Arm   Arm:  L lower arm   Splint type:  Ulnar gutter   Supplies:  Ortho-Glass Post-procedure details:    Pain:  Unchanged   Sensation:  Normal   Patient tolerance of procedure:  Tolerated well, no immediate complications      ____________________________________________   INITIAL IMPRESSION / ASSESSMENT AND PLAN / ED COURSE  Pertinent labs & imaging results that were available during my care of the patient were  reviewed by me and considered in my medical decision making (see chart for details).  Nondisplaced ulnar fracture.  No bony ankle injury.  Will attempt to ambulate patient to see if she can go home.  Clinical Course  Comment By Time  The patient's radiographs were notable for the ulnar fracture and she has an ulnar gutter splint in place.  However the staff attempted to admit the patient and she is completely unable to stand on her own or ambulate with a rolling walker which she uses at home.  She and the family do not feel that she is safe to go home given that she cannot even stand on her own.  I am checking blood work and a urinalysis to see if she has any acute medical issues for which she needs to be admitted (of note she has been consistently tachycardic, she has no symptoms other than the forearm discomfort).  Anticipating that she will not, I have ordered physical therapy consult and social work consult for placement.  I am also ordering a hospital bed. Loleta Rose, MD 07/27 2150  Lab workup unremarkable, no indication for medical admission.  Continuing with plan for social work and PT consults. Loleta Rose, MD 07/27 2326    ____________________________________________  FINAL CLINICAL IMPRESSION(S) / ED DIAGNOSES  Final diagnoses:  Ulnar fracture, left, closed, initial encounter  Fall at home, initial encounter  Ankle strain, left, initial encounter  Unable to ambulate     MEDICATIONS GIVEN DURING THIS VISIT:  Medications  levothyroxine (SYNTHROID, LEVOTHROID) tablet 100 mcg (not administered)  multivitamin with minerals tablet (not administered)  polyethylene glycol 0.4% and propylene glycol 0.3% (SYSTANE) ophthalmic gel (not administered)     NEW OUTPATIENT MEDICATIONS STARTED DURING THIS VISIT:  New Prescriptions   No medications on file      Note:  This document was prepared using Dragon voice recognition software and may include unintentional dictation  errors.    Loleta Rose, MD 12/16/15 2329

## 2015-12-17 DIAGNOSIS — S52692A Other fracture of lower end of left ulna, initial encounter for closed fracture: Secondary | ICD-10-CM | POA: Diagnosis not present

## 2015-12-17 DIAGNOSIS — Z7401 Bed confinement status: Secondary | ICD-10-CM | POA: Diagnosis not present

## 2015-12-17 DIAGNOSIS — S82892A Other fracture of left lower leg, initial encounter for closed fracture: Secondary | ICD-10-CM | POA: Diagnosis not present

## 2015-12-17 MED ORDER — POLYVINYL ALCOHOL 1.4 % OP SOLN: 1.0000 [drp] | Freq: Three times a day (TID) | OPHTHALMIC | Status: DC | PRN

## 2015-12-17 NOTE — Progress Notes (Signed)
As per Patients and daughters requested this worker faxed out pt information for potential SNF placement ( Private Pay). Awaiting potential bed offers. Family is uncertain if they will return home or pay for SNF. Awaiting PT consult.  [employer] LCSW [phone]

## 2015-12-17 NOTE — ED Notes (Signed)
Family at bedside. 

## 2015-12-17 NOTE — ED Notes (Signed)
Pt given breakfast tray

## 2015-12-17 NOTE — ED Notes (Signed)
Pt given lunch

## 2015-12-17 NOTE — ED Notes (Signed)
Placed patient on bedpan

## 2015-12-17 NOTE — ED Provider Notes (Signed)
Patient with fall, ulnar fracture and ambulatory dysfunction. Physical Exam  BP (!) 143/76   Pulse (!) 105   Temp 98.2 F (36.8 C) (Oral)   Resp 19   Ht 5\' 4"  (1.626 m)   Wt 103 lb (46.7 kg)   SpO2 96%   BMI 17.68 kg/m   Physical Exam Resting comfortably at this time without any distress. ED Course  Procedures  MDM Patient has been evaluated by physical therapy and recommended for rehabilitation. She is also been accepted to rehabilitation. She'll be transferred. The patient as well as the family were updated by the social worker.       Myrna Blazer, MD 12/17/15 [phone]

## 2015-12-17 NOTE — ED Notes (Signed)
Pts belongings sent w/ daughter, glasses on pt w/ transport

## 2015-12-17 NOTE — Evaluation (Signed)
Physical Therapy Evaluation Patient Details Name: Maria Walker MRN: [id number] DOB: March 11, 1927 Today's Date: 12/17/2015   History of Present Illness  Pt is an 82 yr old female presenting to the ED following a posterior fall from standing with resultant LUE and RLE pain. Imaging revealed minimally displaced L distal ulnar fx which is being medically managed. PMH significant for hyponatremia, hypothyroidism, chronic lung disease, and hx of L partial hip replacement 2014.    Clinical Impression  Prior to admission, pt was mod I with household mobility and ADLs using RW.  Pt lives alone in a one-story home with 3 STE.  Per discussion with RN, RN is reporting that MD Hooten recommends pt be WBAT on the LUE using platform RW. Currently pt is min-mod A for bed mobility and min A for sit <> stand transfers and ambulation x27ft with the L platform RW.  Pt presents with generalized weakness, instability, L forearm pain, and an overall decreased independence with functional mobility which is not her baseline. Pt would benefit from skilled PT to address noted impairments and functional limitations.  Recommend pt discharge to STR when medically appropriate.     Follow Up Recommendations SNF    Equipment Recommendations   (L platform walker)    Recommendations for Other Services       Precautions / Restrictions Precautions Precautions: Fall Restrictions Weight Bearing Restrictions: Yes LUE Weight Bearing: Weight bearing as tolerated; L platform RW     Mobility  Bed Mobility Overal bed mobility: Needs Assistance Bed Mobility: Supine to Sit;Sit to Supine Supine to sit: Min assist Sit to supine: Mod assist General bed mobility comments: Pt very slow and deliberate with bed mobility, able to utilize LE bridging to scoot hips to EOB. Min A for management of torso during supine > sit and mod A for management of bilat LEs and torso during sit > supine. Increased time and vc's  provided.  Transfers Overall transfer level: Needs assistance Equipment used: Left platform walker Transfers: Sit to/from Stand (x 2 trials) Sit to Stand: Min assist General transfer comment: Pt able to complete sit <> stand x2 from EOB using RUE on platform RW and with minimal use of LUE. Verbal cues required for hand/foot placement and min A for boost up to standing. Increased time required.  Ambulation/Gait Ambulation/Gait assistance: Min assist Ambulation Distance (Feet): 5 Feet Assistive device: Left platform walker Gait Pattern/deviations: Decreased step length - right;Decreased step length - left;Narrow base of support Gait velocity: Decreased General Gait Details: Pt with very short and deliberate steps. Decreased ability to progress platform RW. Requires min A for RW progression and stability. While she has no LOBs, she exhibits generalized unsteadiness and hesitancy with gait.  Stairs    Wheelchair Mobility    Modified Rankin (Stroke Patients Only)       Balance Overall balance assessment: Needs assistance Sitting-balance support: Feet supported;Bilateral upper extremity supported Sitting balance-Leahy Scale: Fair     Standing balance support: Bilateral upper extremity supported (on platform RW) Standing balance-Leahy Scale: Fair Comments: gereralized instability using platform RW with gait requiring min assist for safety      Pertinent Vitals/Pain Pain Assessment: 0-10 Pain Score: 4  Pain Location: L wrist Pain Intervention(s): Limited activity within patient's tolerance;Monitored during session  Additionally, pt with c/o R heel and ankle soreness in standing.   HR 91 bpm at rest, up to the 130s with mobility SaO2 was not consistently getting a good reading, appeared for the most part to  stay >95% BP: 125/73 mmHg at rest, up to 153/81 mmHg with bed mobility    Home Living Family/patient expects to be discharged to:: Skilled nursing facility Living  Arrangements: Alone Available Help at Discharge: Family;Friend(s) Type of Home: House Home Access: Stairs to enter Entrance Stairs-Rails: Can reach both Entrance Stairs-Number of Steps: 2 Home Layout: One level Home Equipment: Environmental consultant - 2 wheels      Prior Function Level of Independence: Independent with assistive device(s): RW Comments: Household mobility and basic ADLs     Hand Dominance        Extremity/Trunk Assessment   Upper Extremity Assessment: Generalized weakness   Lower Extremity Assessment: Generalized weakness  Cervical / Trunk Assessment: Significant kyphosis of the thoracic spine   Communication   Communication: No difficulties  Cognition Arousal/Alertness: Awake/alert Behavior During Therapy: WFL for tasks assessed/performed Overall Cognitive Status: Within Functional Limits for tasks assessed    General Comments General comments (skin integrity, edema, etc.): Skin breakdown noted about thoracic spine          Assessment/Plan    PT Assessment Patient needs continued PT services  PT Diagnosis Difficulty walking;Generalized weakness;Acute pain   PT Problem List Decreased strength;Decreased activity tolerance;Decreased balance;Decreased mobility;Decreased knowledge of use of DME  PT Treatment Interventions DME instruction;Gait training;Stair training;Functional mobility training;Therapeutic activities;Therapeutic exercise;Balance training;Patient/family education   PT Goals (Current goals can be found in the Care Plan section) Acute Rehab PT Goals Patient Stated Goal: To go home PT Goal Formulation: With patient Time For Goal Achievement: 12/31/15 Potential to Achieve Goals: Good    Frequency 7X/week   Barriers to discharge Assist levels         End of Session Equipment Utilized During Treatment: Gait belt Activity Tolerance: Patient limited by fatigue Patient left: in bed;with call bell/phone within reach;with bed alarm set;with  family/visitor present Nurse Communication: Mobility status;Precautions;Weight bearing status         Time: 8882-8003 PT Time Calculation (min) (ACUTE ONLY): 26 min   Charges:         PT G Codes:        Bland Rudzinski, SPT 12/17/2015, 1:30 PM

## 2015-12-17 NOTE — Clinical Social Work Note (Signed)
Clinical Social Work Assessment  Patient Details  Name: Maria Walker MRN: [id number] Date of Birth: 1926/12/12  Date of referral:  12/17/15               Reason for consult:  Facility Placement                Permission sought to share information with:  Family Supports, Customer service manager Permission granted to share information::  Yes, Verbal Permission Granted  Name::     Kathaleen Bury ( Daughter [phone])  Agency::  Facilities if needed  Relationship::     Contact Information:  Kathaleen Bury ( Daughter [phone])  Housing/Transportation Living arrangements for the past 2 months:  [place] of Information:  Patient, Adult Children Patient Interpreter Needed:  None Criminal Activity/Legal Involvement Pertinent to Current Situation/Hospitalization:  No - Comment as needed Significant Relationships:  Adult Children, [place], Other Family Members, Siblings, Friend, Industrial/product designer Lives with:  Self Do you feel safe going back to the place where you live?  Yes Need for family participation in patient care:  Yes (Comment)  Care giving concerns: Daughter is concerned that her mother will not be able to [facility] the walker and will need a lot more assistance with ADL's. Currently her mother showers on her own, feeds/dresses herself and lives alone. She has a lot of friends stopping by every day.   Social Worker assessment / plan: LCSW met with patient and daughter and received verbal consent to speak to all family and facility respresentatives if required. LCSW met with patient who is oriented x4. Patient has had an injury to her arm and struggles at this time to use walker and ADL's are hindered and are affected.  LCSW explained private pay vs insurance coverage and family will receive information on different supports both in home and SNF and if needed will pay Privately for additional care. Patient has excellent family and community support. We are awaiting PT  consult and recommendations. LCSW provided family with SNF list and [facility] community resource guide to assist family.   Employment status:  Retired Forensic scientist:  [employer]) PT Recommendations:  Not assessed at this time Information / Referral to community resources:  [place], Other (Comment Required) (IN home support)  Patient/Family's Response to care: We will make sure patient gets all the help and support she needs  Patient/Family's Understanding of and Emotional Response to Diagnosis, Current Treatment, and Prognosis: Patient understands that PT would likely assist her in recoving and strength building endeavors at this time. They will decide about in home vs SNF support shortly.  Emotional Assessment Appearance:    Attitude/Demeanor/Rapport:   (Polite, pleasant x4 oriented) Affect (typically observed):  Accepting, Adaptable, Happy Orientation:  Oriented to Self, Oriented to Place, Oriented to  Time, Oriented to Situation Alcohol / Substance use:  Never Used Psych involvement (Current and /or in the community):  No (Comment)  Discharge Needs  Concerns to be addressed:  Care Coordination Readmission within the last 30 days:  No Current discharge risk:  None Barriers to Discharge:  No Barriers Identified   Joana Reamer, LCSW 12/17/2015, 10:37 AM

## 2015-12-17 NOTE — ED Notes (Signed)
Patient was repositioned and turned for comfort.

## 2015-12-17 NOTE — Progress Notes (Signed)
PT Cancellation Note  Patient Details Name: JUSTA ARCIERO MRN: [id number] DOB: 1926/08/20   Cancelled Treatment:    Reason Eval/Treat Not Completed: called RN at 08:45 to clarify LUE WB'ing status d/t minimally displaced L distal ulna fx; RN attending to EMS arrival. Awaiting call back before proceeding with PT evaluation.   Damari Suastegui, SPT 12/17/2015, 9:26 AM

## 2015-12-17 NOTE — [place] FL2 (Signed)
  [place] MEDICAID FL2 LEVEL OF CARE SCREENING TOOL     IDENTIFICATION  Patient Name: Maria Walker Birthdate: Feb 21, 1927 Sex: female Admission Date (Current Location): 12/16/2015  [place] and [place] Number:  Chiropodist and Address:  [facility], [address] [place], [place] [address]      Provider Number: [id number]  Attending Physician Name and Address:  No att. providers found  Relative Name and Phone Number:       Current Level of Care: Hospital Recommended Level of Care: Skilled Nursing Facility Prior Approval Number:    Date Approved/Denied:   PASRR Number:  [id number]  Discharge Plan: SNF    Current Diagnoses: Patient Active Problem List   Diagnosis Date Noted  . Mild malnutrition (HCC) 07/28/2015  . Hypothyroidism   . Chronic lung disease   . Abnormal TSH 07/03/2015  . Hyponatremia 07/02/2015    Orientation RESPIRATION BLADDER Height & Weight     Self, Time, Situation, Place  Normal Continent Weight: 103 lb (46.7 kg) Height:  5\' 4"  (162.6 cm)  BEHAVIORAL SYMPTOMS/MOOD NEUROLOGICAL BOWEL NUTRITION STATUS      Continent Diet (Normal)  AMBULATORY STATUS COMMUNICATION OF NEEDS Skin   Limited Assist Verbally Bruising, Other (Comment) (Elbow and ankle)                       Personal Care Assistance Level of Assistance  Bathing, Feeding, Dressing, Total care Bathing Assistance: Limited assistance Feeding assistance: Limited assistance Dressing Assistance: Limited assistance Total Care Assistance: Limited assistance   Functional Limitations Info  Sight, Hearing, Speech Sight Info: Adequate Hearing Info: Adequate Speech Info: Adequate    SPECIAL CARE FACTORS FREQUENCY  PT (By licensed PT), OT (By licensed OT)     PT Frequency: x5 OT Frequency: x5            Contractures Contractures Info: Not present    Additional Factors Info  Allergies, Code Status Code Status Info: full Allergies Info:  [place]           Current Medications (12/17/2015):  This is the current hospital active medication list Current Facility-Administered Medications  Medication Dose Route Frequency Provider Last Rate Last Dose  . levothyroxine (SYNTHROID, LEVOTHROID) tablet 100 mcg  100 mcg Oral QAC breakfast Loleta Rose, MD   100 mcg at 12/17/15 0258  . multivitamin with minerals tablet   Oral Daily Loleta Rose, MD   1 tablet at 12/17/15 [id number]  . polyvinyl alcohol (LIQUIFILM TEARS) 1.4 % ophthalmic solution 1 drop  1 drop Both Eyes TID PRN Loleta Rose, MD       Current Outpatient Prescriptions  Medication Sig Dispense Refill  . levothyroxine (SYNTHROID, LEVOTHROID) 100 MCG tablet Take 1 tablet (100 mcg total) by mouth daily before breakfast. 90 tablet 3  . Multiple Vitamins-Minerals (CENTRUM SILVER PO) Take 1 tablet by mouth daily.    Bertram Gala Glycol-Propyl Glycol (SYSTANE) 0.4-0.3 % GEL ophthalmic gel Place 1 application into both eyes 3 (three) times daily as needed.       Discharge Medications: Please see discharge summary for a list of discharge medications.  Relevant Imaging Results:  Relevant Lab Results:   Additional Information SSN # [id number], [place], [place]

## 2015-12-17 NOTE — ED Notes (Signed)
Pt put dentures back in, top and bottom.

## 2015-12-17 NOTE — ED Notes (Signed)
Pt called out stating she needed to urine. Pt placed on bedpan, urinated, and cleaned. Pt covered back up with blankets and lights dimmed for comfort.

## 2015-12-17 NOTE — Progress Notes (Signed)
LCSW presented family and patient with all bed offers and they selected [facility]. LCSW and family spoke to admissions coordinator. Carla provided room number [address]/[id number]. Called information to ED nurse and spoke to EDP for discharge papers. LCSW consulted with Family and arranged a meeting directly with admissions coordinator. The family is unable to transport patient and will be going to [facility] via EMS. ED secretary was advised. No further needs.  [employer] LCSW [phone]

## 2015-12-17 NOTE — ED Notes (Signed)
Pts placement: [address]

## 2015-12-17 NOTE — ED Notes (Signed)
Pt placed on bedpan

## 2015-12-17 NOTE — ED Notes (Signed)
Attempting to find an inpatient bed for pt for overnight. Pt stated she was ok, but this RN wants to have pt be comfortable.

## 2015-12-17 NOTE — ED Notes (Signed)
Inpatient bed was found and pt moved over to it. Pt verbalized comfort. Lights dimmed, tv on.   Pt took dentures out, they are labeled in a denture cup in the room. Pt has shoes and walker in the room as well. Daughter took pt clothes home with her.

## 2015-12-18 DIAGNOSIS — M6281 Muscle weakness (generalized): Secondary | ICD-10-CM | POA: Diagnosis not present

## 2015-12-18 LAB — URINE CULTURE: SPECIAL REQUESTS: NORMAL

## 2015-12-20 ENCOUNTER — Encounter: Payer: Self-pay | Admitting: Internal Medicine

## 2015-12-20 ENCOUNTER — Non-Acute Institutional Stay (SKILLED_NURSING_FACILITY): Payer: Medicare Other | Admitting: Internal Medicine

## 2015-12-20 DIAGNOSIS — E876 Hypokalemia: Secondary | ICD-10-CM

## 2015-12-20 DIAGNOSIS — E46 Unspecified protein-calorie malnutrition: Secondary | ICD-10-CM | POA: Diagnosis not present

## 2015-12-20 DIAGNOSIS — E871 Hypo-osmolality and hyponatremia: Secondary | ICD-10-CM

## 2015-12-20 DIAGNOSIS — D649 Anemia, unspecified: Secondary | ICD-10-CM

## 2015-12-20 DIAGNOSIS — S52202S Unspecified fracture of shaft of left ulna, sequela: Secondary | ICD-10-CM

## 2015-12-20 DIAGNOSIS — M6281 Muscle weakness (generalized): Secondary | ICD-10-CM | POA: Diagnosis not present

## 2015-12-20 DIAGNOSIS — E039 Hypothyroidism, unspecified: Secondary | ICD-10-CM | POA: Diagnosis not present

## 2015-12-20 DIAGNOSIS — H04123 Dry eye syndrome of bilateral lacrimal glands: Secondary | ICD-10-CM | POA: Diagnosis not present

## 2015-12-20 NOTE — Progress Notes (Signed)
LOCATION: [facility]  PCP: Tillman Abide, MD   Code Status: DNR  Goals of care: Advanced Directive information Advanced Directives 12/20/2015  Does patient have an advance directive? Yes  Type of Advance Directive Out of facility DNR (pink MOST or yellow form)  Does patient want to make changes to advanced directive? No - Patient declined  Copy of advanced directive(s) in chart? Yes  Would patient like information on creating an advanced directive? -       Extended Emergency Contact Information Primary Emergency Contact: Mardelle Matte [place] of [place] Home Phone: [phone] Mobile Phone: [phone] Relation: Daughter Secondary Emergency Contact: Copeland,Edna S Address: [address]          [place], [place] [address] Darden Amber of [place] Home Phone: [phone] Relation: Sister   Allergies  Allergen Reactions  . Codeine Other (See Comments)    Reaction:  Hallucinations     Chief Complaint  Patient presents with  . New Admit To SNF    New Admission     HPI:  Patient is a 82 y.o. female seen today for short term rehabilitation post ED visit with left arm pain post left ulnar nondisplaced fracture. She had a splint placed. She is seen in her room today  Review of Systems:  Constitutional: Negative for fever, chills, diaphoresis. Feels weak and tired.  HENT: Negative for headache, congestion, nasal discharge. Eyes: Negative for double vision and discharge. Wears glasses. Respiratory: Negative for cough, shortness of breath and wheezing.   Cardiovascular: Negative for chest pain, palpitations, leg swelling.  Gastrointestinal: Negative for heartburn, nausea, vomiting, abdominal pain. Last bowel movement was yesterday.  Genitourinary: Negative for dysuria and flank pain.  Musculoskeletal: Negative for fall in the facility.  Skin: Negative for itching, rash.  Neurological: Negative for dizziness. Psychiatric/Behavioral: Negative for  depression   Past Medical History:  Diagnosis Date  . Apical lung scarring   . Chronic lung disease    from past pneumonia  . Hyponatremia    with cessation of thyroid replacement  . Hypothyroidism    after goiter surgery   Past Surgical History:  Procedure Laterality Date  . APPENDECTOMY    . HIP SURGERY  2014   Left partial hip  . THYROIDECTOMY, PARTIAL  1948   goiter   Social History:   reports that she has never smoked. She does not have any smokeless tobacco history on file. She reports that she does not drink alcohol or use drugs.  Family History  Problem Relation Age of Onset  . Hypertension           Medications:   Medication List       Accurate as of 12/20/15 11:56 AM. Always use your most recent med list.          acetaminophen 325 MG tablet Commonly known as:  TYLENOL Take 650 mg by mouth every 4 (four) hours as needed for mild pain.   CENTRUM SILVER PO Take 1 tablet by mouth daily.   levothyroxine 100 MCG tablet Commonly known as:  SYNTHROID, LEVOTHROID Take 1 tablet (100 mcg total) by mouth daily before breakfast.   SYSTANE 0.4-0.3 % Gel ophthalmic gel Generic drug:  Polyethyl Glycol-Propyl Glycol Place 1 application into both eyes 3 (three) times daily as needed.       Immunizations: Immunization History  Administered Date(s) Administered  . PPD Test 12/17/2015  . Pneumococcal Polysaccharide-23 05/22/1994     Physical Exam:  Vitals:   12/20/15 1114  BP: 136/69  Pulse: 96  Resp: 18  Temp: 98.4 F (36.9 C)  TempSrc: Oral  SpO2: 91%  Weight: 103 lb (46.7 kg)  Height: 5\' 4"  (1.626 m)   Body mass index is 17.68 kg/m.  General- elderly female, thin built, in no acute distress Head- normocephalic, atraumatic Nose- no nasal discharge Throat- moist mucus membrane, has dentures Eyes- PERRLA, EOMI, no pallor, no icterus, no discharge Neck- no cervical lymphadenopathy Cardiovascular- normal s1,s2, no murmur, trace leg  edema Respiratory- bilateral clear to auscultation, no wheeze, no rhonchi, no crackles, no use of accessory muscles Abdomen- bowel sounds present, soft, non tender Musculoskeletal- able to move all 4 extremities, generalized weakness, has splint in left forearm, able to move her fingers Neurological- alert and oriented to person, place and time Skin- warm and dry Psychiatry- normal mood and affect    Labs reviewed: Basic Metabolic Panel:  Recent Labs  26/37/85 0743 07/28/15 1308 12/16/15 2152  NA 134* 131* 131*  K 3.3* 3.9 3.3*  CL 105 94* 95*  CO2 27 28 28   GLUCOSE 92 96 149*  BUN 6 10 8   CREATININE 0.71 0.64 0.60  CALCIUM 7.6* 9.1 8.6*  PHOS  --  3.9  --    Liver Function Tests:  Recent Labs  07/02/15 1225 07/28/15 1308 12/16/15 2152  AST 73*  --  33  ALT 16  --  16  ALKPHOS 64  --  66  BILITOT 1.3*  --  0.8  PROT 8.6*  --  7.7  ALBUMIN 4.1 4.0 3.9   No results for input(s): LIPASE, AMYLASE in the last 8760 hours. No results for input(s): AMMONIA in the last 8760 hours. CBC:  Recent Labs  07/02/15 1225 07/03/15 0406 12/16/15 2152  WBC 5.7 4.6 7.0  NEUTROABS 4.7  --  5.8  HGB 13.7 11.6* 11.8*  HCT 41.5 34.6* 35.1  MCV 87.3 86.3 86.6  PLT 258 214 197   Cardiac Enzymes:  Recent Labs  07/02/15 1642 07/02/15 2239 07/03/15 0406  CKTOTAL 1,608*  --  1,086*  TROPONINI 0.34* 0.31* 0.27*   BNP: Invalid input(s): POCBNP CBG: No results for input(s): GLUCAP in the last 8760 hours.  Radiological Exams: Dg Forearm Left  Result Date: 12/16/2015 CLINICAL DATA:  Fall from standing at home this evening, c/o left wrist, left elbow and right ankle pain EXAM: LEFT FOREARM - 2 VIEW COMPARISON:  None. FINDINGS: There is a nondisplaced fracture of the distal ulna. No other fractures. The wrist and elbow joints are normally aligned. Bones are demineralized. There is ulnar sided wrist soft tissue swelling. IMPRESSION: Nondisplaced fracture of the distal ulna at the  wrist joint. No dislocation. Electronically Signed   By: Amie Portland M.D.   On: 12/16/2015 19:09  Dg Wrist Complete Left  Result Date: 12/16/2015 CLINICAL DATA:  Left arm pain secondary to a fall at home tonight. EXAM: LEFT WRIST - COMPLETE 3+ VIEW COMPARISON:  None. FINDINGS: There is a minimally displaced fracture through the medial aspect of the distal ulna. No other acute abnormality. 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 Diffuse osteopenia. IMPRESSION: Minimally displaced fracture of the distal ulna near the base of the ulnar styloid. Electronically Signed   By: Francene Boyers M.D.   On: 12/16/2015 19:11  Dg Ankle Complete Right  Result Date: 12/16/2015 CLINICAL DATA:  Fall from standing at home this evening, c/o left wrist, left elbow and right ankle pain EXAM: RIGHT ANKLE - COMPLETE 3+ VIEW  COMPARISON:  None. FINDINGS: No fracture. Ankle mortise is normally spaced and aligned. The bones are diffusely demineralized. There is mild diffuse soft tissue edema which predominates laterally. IMPRESSION: No fracture or dislocation. Electronically Signed   By: Amie Portland M.D.   On: 12/16/2015 19:10   Assessment/Plan  Left ulnar fracture Status post splint placement. Has orthopedic follow up. Will have patient work with PT/OT as tolerated to regain strength and restore function.  Fall precautions are in place. Continue tylenol 650 mg q4h prn pain  Anemia unspecified Monitor cbc  Hyponatremia Monitor bmp  Hypokalemia Monitor bmp  Protein calorie malnutrition Monitor po intake, get RD consult  Hypothyroidism Lab Results  Component Value Date   TSH 1.84 07/28/2015   Continue levothyroxine 100 mcg daily  Dry eyes Continue systane eye drops prn and monitor    Goals of care: short term rehabilitation   Labs/tests ordered: cbc, bmp  Family/ staff Communication: reviewed care plan with patient and nursing supervisor    Oneal Grout, MD Internal  Medicine [facility] [address] [place], [place] [address] Cell Phone (Monday-Friday 8 am - 5 pm): [phone] On Call: [phone] and follow prompts after 5 pm and on weekends Office Phone: [phone] Office Fax: [phone]

## 2015-12-21 DIAGNOSIS — Z79899 Other long term (current) drug therapy: Secondary | ICD-10-CM | POA: Diagnosis not present

## 2015-12-21 DIAGNOSIS — R2689 Other abnormalities of gait and mobility: Secondary | ICD-10-CM | POA: Diagnosis not present

## 2015-12-21 DIAGNOSIS — M6281 Muscle weakness (generalized): Secondary | ICD-10-CM | POA: Diagnosis not present

## 2015-12-21 LAB — BASIC METABOLIC PANEL
BUN: 11 mg/dL (ref 4–21)
Creatinine: 0.5 mg/dL (ref 0.5–1.1)
Glucose: 102 mg/dL
POTASSIUM: 3.9 mmol/L (ref 3.4–5.3)
SODIUM: 132 mmol/L — AB (ref 137–147)

## 2015-12-21 LAB — HEPATIC FUNCTION PANEL
ALK PHOS: 53 U/L (ref 25–125)
ALT: 11 U/L (ref 7–35)
AST: 21 U/L (ref 13–35)
BILIRUBIN, TOTAL: 0.8 mg/dL

## 2015-12-21 LAB — CBC AND DIFFERENTIAL
HEMATOCRIT: 30 % — AB (ref 36–46)
HEMOGLOBIN: 9.8 g/dL — AB (ref 12.0–16.0)
Platelets: 174 10*3/uL (ref 150–399)
WBC: 3.4 10^3/mL

## 2015-12-22 ENCOUNTER — Non-Acute Institutional Stay (SKILLED_NURSING_FACILITY): Payer: Medicare Other | Admitting: Family

## 2015-12-22 DIAGNOSIS — D509 Iron deficiency anemia, unspecified: Secondary | ICD-10-CM | POA: Diagnosis not present

## 2015-12-22 DIAGNOSIS — E871 Hypo-osmolality and hyponatremia: Secondary | ICD-10-CM

## 2015-12-22 DIAGNOSIS — E441 Mild protein-calorie malnutrition: Secondary | ICD-10-CM

## 2015-12-22 DIAGNOSIS — R2689 Other abnormalities of gait and mobility: Secondary | ICD-10-CM | POA: Diagnosis not present

## 2015-12-22 DIAGNOSIS — M6281 Muscle weakness (generalized): Secondary | ICD-10-CM | POA: Diagnosis not present

## 2015-12-22 MED ORDER — CALCIUM CARBONATE-VITAMIN D 500-200 MG-UNIT PO TABS: 1.0000 | ORAL_TABLET | Freq: Two times a day (BID) | ORAL | Status: DC

## 2015-12-22 NOTE — Progress Notes (Signed)
Location:   [facility]    Place of Service:  SNF [phone]) Provider:  Terik Haughey FNP-C   Tillman Abide, MD  Patient Care Team: Karie Schwalbe, MD as PCP - General (Internal Medicine)  Extended Emergency Contact Information Primary Emergency Contact: Clance Boll of [place] Home Phone: [phone] Mobile Phone: [phone] Relation: Daughter Secondary Emergency Contact: Copeland,Edna S Address: [address]          [place], [place] [address] Darden Amber of [place] Home Phone: [phone] Relation: Sister  Code Status:  DNR  Goals of care: Advanced Directive information Advanced Directives 12/20/2015  Does patient have an advance directive? Yes  Type of Advance Directive Out of facility DNR (pink MOST or yellow form)  Does patient want to make changes to advanced directive? No - Patient declined  Copy of advanced directive(s) in chart? Yes  Would patient like information on creating an advanced directive? -     Chief Complaint  Patient presents with  . Acute Visit    abnormal lab results    HPI:  Pt is a 81 y.o. female seen today at [facility]  for an acute visit for evaluation of abnormal lab results She is seen in her room today. She denies any acute issues. She states left arm pain under control with current regimen. She continues to work well with PT/OT. Her recent lab results showed WBC 3.4, Na 132, Ca 7.7, Alb 3.05 ( 12/21/2015). Facility staff reports no new concerns.    Past Medical History:  Diagnosis Date  . Apical lung scarring   . Chronic lung disease    from past pneumonia  . Hyponatremia    with cessation of thyroid replacement  . Hypothyroidism    after goiter surgery   Past Surgical History:  Procedure Laterality Date  . APPENDECTOMY    . HIP SURGERY  2014   Left partial hip  . THYROIDECTOMY, PARTIAL  1948   goiter    Allergies  Allergen Reactions  . Codeine Other (See Comments)   Reaction:  Hallucinations       Medication List       Accurate as of 12/22/15  1:59 PM. Always use your most recent med list.          acetaminophen 325 MG tablet Commonly known as:  TYLENOL Take 650 mg by mouth every 4 (four) hours as needed for mild pain.   CENTRUM SILVER PO Take 1 tablet by mouth daily.   levothyroxine 100 MCG tablet Commonly known as:  SYNTHROID, LEVOTHROID Take 1 tablet (100 mcg total) by mouth daily before breakfast.   SYSTANE 0.4-0.3 % Gel ophthalmic gel Generic drug:  Polyethyl Glycol-Propyl Glycol Place 1 application into both eyes 3 (three) times daily as needed.       Review of Systems  Constitutional: Negative for activity change, appetite change, chills, fatigue and fever.  HENT: Negative for congestion, rhinorrhea, sinus pressure, sneezing and sore throat.   Eyes: Negative.   Respiratory: Negative for cough, chest tightness, shortness of breath and wheezing.   Gastrointestinal: Negative for abdominal distention, abdominal pain, constipation, diarrhea, nausea and vomiting.  Genitourinary: Negative for dysuria, flank pain and urgency.  Musculoskeletal:       Left arm cast   Skin: Negative for color change, pallor and rash.  Neurological: Negative for dizziness, seizures, syncope, light-headedness and headaches.  Psychiatric/Behavioral: Negative for agitation, confusion, hallucinations and sleep disturbance. The patient is not nervous/anxious.  Immunization History  Administered Date(s) Administered  . PPD Test 12/17/2015  . Pneumococcal Polysaccharide-23 05/22/1994   Pertinent  Health Maintenance Due  Topic Date Due  . DEXA SCAN  10/17/1993  . PNA vac Low Risk Adult (2 of 2 - PCV13) 05/23/1995  . INFLUENZA VACCINE  02/20/2016 (Originally 12/21/2015)   No flowsheet data found. Functional Status Survey:    Vitals:   12/22/15 1204  BP: 123/68  Pulse: 78  Resp: 20  Temp: 98.2 F (36.8 C)  SpO2: 95%  Weight: 91 lb (41.3 kg)    Height: 5\' 4"  (1.626 m)   Body mass index is 15.62 kg/m. Physical Exam  Constitutional: She is oriented to person, place, and time. She appears well-developed.  Thin Elderly in no acute distress   HENT:  Head: Normocephalic.  Mouth/Throat: Oropharynx is clear and moist. No oropharyngeal exudate.  Eyes: Conjunctivae and EOM are normal. Pupils are equal, round, and reactive to light. Right eye exhibits no discharge. Left eye exhibits no discharge. No scleral icterus.  Neck: Normal range of motion. No JVD present. No thyromegaly present.  Cardiovascular: Normal rate, regular rhythm, normal heart sounds and intact distal pulses.  Exam reveals no gallop and no friction rub.   No murmur heard. Pulmonary/Chest: Effort normal and breath sounds normal. No respiratory distress. She has no wheezes. She has no rales.  Abdominal: Soft. Bowel sounds are normal. She exhibits no distension. There is no tenderness. There is no rebound and no guarding.  Musculoskeletal: She exhibits no edema, tenderness or deformity.  Normal ROM except left Arm   Lymphadenopathy:    She has no cervical adenopathy.  Neurological: She is oriented to person, place, and time.  Skin: Skin is warm and dry. No rash noted. No erythema. No pallor.  Psychiatric: She has a normal mood and affect.    Labs reviewed:  Recent Labs  07/05/15 0743 07/28/15 1308 12/16/15 2152  NA 134* 131* 131*  K 3.3* 3.9 3.3*  CL 105 94* 95*  CO2 27 28 28   GLUCOSE 92 96 149*  BUN 6 10 8   CREATININE 0.71 0.64 0.60  CALCIUM 7.6* 9.1 8.6*  PHOS  --  3.9  --     Recent Labs  07/02/15 1225 07/28/15 1308 12/16/15 2152  AST 73*  --  33  ALT 16  --  16  ALKPHOS 64  --  66  BILITOT 1.3*  --  0.8  PROT 8.6*  --  7.7  ALBUMIN 4.1 4.0 3.9    Recent Labs  07/02/15 1225 07/03/15 0406 12/16/15 2152  WBC 5.7 4.6 7.0  NEUTROABS 4.7  --  5.8  HGB 13.7 11.6* 11.8*  HCT 41.5 34.6* 35.1  MCV 87.3 86.3 86.6  PLT 258 214 197   Lab  Results  Component Value Date   TSH 1.84 07/28/2015   No results found for: HGBA1C No results found for: CHOL, HDL, LDLCALC, LDLDIRECT, TRIG, CHOLHDL  Significant Diagnostic Results in last 30 days:  Dg Forearm Left  Result Date: 12/16/2015 CLINICAL DATA:  Fall from standing at home this evening, c/o left wrist, left elbow and right ankle pain EXAM: LEFT FOREARM - 2 VIEW COMPARISON:  None. FINDINGS: There is a nondisplaced fracture of the distal ulna. No other fractures. The wrist and elbow joints are normally aligned. Bones are demineralized. There is ulnar sided wrist soft tissue swelling. IMPRESSION: Nondisplaced fracture of the distal ulna at the wrist joint. No dislocation. Electronically Signed   By:  Amie Portland M.D.   On: 12/16/2015 19:09  Dg Wrist Complete Left  Result Date: 12/16/2015 CLINICAL DATA:  Left arm pain secondary to a fall at home tonight. EXAM: LEFT WRIST - COMPLETE 3+ VIEW COMPARISON:  None. FINDINGS: There is a minimally displaced fracture through the medial aspect of the distal ulna. No other acute abnormality. 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 Diffuse osteopenia. IMPRESSION: Minimally displaced fracture of the distal ulna near the base of the ulnar styloid. Electronically Signed   By: Francene Boyers M.D.   On: 12/16/2015 19:11  Dg Ankle Complete Right  Result Date: 12/16/2015 CLINICAL DATA:  Fall from standing at home this evening, c/o left wrist, left elbow and right ankle pain EXAM: RIGHT ANKLE - COMPLETE 3+ VIEW COMPARISON:  None. FINDINGS: No fracture. Ankle mortise is normally spaced and aligned. The bones are diffusely demineralized. There is mild diffuse soft tissue edema which predominates laterally. IMPRESSION: No fracture or dislocation. Electronically Signed   By: Amie Portland M.D.   On: 12/16/2015 19:10   Assessment/Plan 1. Hyponatremia Na 132 ( 12/21/2015) previous 131. Continue to monitor BMP recheck 01/05/2016   2.  Hypocalcemia Ca 7.7 ( 12/21/2015). Start Calcium -Vit D 500-200 mg -units one by mouth twice daily. Monitor BMP.   3. Mild malnutrition (HCC) Alb 3.05 ( 12/21/2015) continue with Protein supplements. Continue MVI. BMP 01/05/2016. 4. Anemia  Hgb 9.8 ( 12/21/2015). Possible due to malnutrition and recent nondisplaced left arm fracture . Continue on MVI for now.Recheck CBC 01/05/2016.      Family/ staff Communication: reviewed plan of care with patient and facility Nurse supervisor.   Labs/tests ordered:  CBC and BMP 01/05/2016.

## 2015-12-23 DIAGNOSIS — M6281 Muscle weakness (generalized): Secondary | ICD-10-CM | POA: Diagnosis not present

## 2015-12-23 DIAGNOSIS — R2689 Other abnormalities of gait and mobility: Secondary | ICD-10-CM | POA: Diagnosis not present

## 2015-12-24 DIAGNOSIS — R2689 Other abnormalities of gait and mobility: Secondary | ICD-10-CM | POA: Diagnosis not present

## 2015-12-24 DIAGNOSIS — M6281 Muscle weakness (generalized): Secondary | ICD-10-CM | POA: Diagnosis not present

## 2015-12-27 ENCOUNTER — Telehealth: Payer: Self-pay

## 2015-12-27 DIAGNOSIS — M6281 Muscle weakness (generalized): Secondary | ICD-10-CM | POA: Diagnosis not present

## 2015-12-27 DIAGNOSIS — R2689 Other abnormalities of gait and mobility: Secondary | ICD-10-CM | POA: Diagnosis not present

## 2015-12-27 NOTE — Telephone Encounter (Signed)
Spoke to Pam. 

## 2015-12-27 NOTE — Telephone Encounter (Signed)
Sorry to hear that. Let her know that I don't go to [facility]---she should have the physician group that goes there see her (they are good)

## 2015-12-27 NOTE — Telephone Encounter (Signed)
Pam (POA) left vm requesting cb; pt has fallen and broken her arm and is in rehab. Pam wants to know if Dr Alphonsus SiasLetvak will [facility]. [facility]ons[facility]Let[facility]. Pt is going to see an orthopedic doctor. Pam just wanted Dr Alphonsus SiasLetvak to be aware. FYI to Dr Alphonsus SiasLetvak.

## 2015-12-28 DIAGNOSIS — R2689 Other abnormalities of gait and mobility: Secondary | ICD-10-CM | POA: Diagnosis not present

## 2015-12-28 DIAGNOSIS — M6281 Muscle weakness (generalized): Secondary | ICD-10-CM | POA: Diagnosis not present

## 2015-12-29 DIAGNOSIS — R2689 Other abnormalities of gait and mobility: Secondary | ICD-10-CM | POA: Diagnosis not present

## 2015-12-29 DIAGNOSIS — S52255A Nondisplaced comminuted fracture of shaft of ulna, left arm, initial encounter for closed fracture: Secondary | ICD-10-CM | POA: Diagnosis not present

## 2015-12-29 DIAGNOSIS — M6281 Muscle weakness (generalized): Secondary | ICD-10-CM | POA: Diagnosis not present

## 2015-12-29 DIAGNOSIS — M25532 Pain in left wrist: Secondary | ICD-10-CM | POA: Diagnosis not present

## 2015-12-30 DIAGNOSIS — M6281 Muscle weakness (generalized): Secondary | ICD-10-CM | POA: Diagnosis not present

## 2015-12-30 DIAGNOSIS — R2689 Other abnormalities of gait and mobility: Secondary | ICD-10-CM | POA: Diagnosis not present

## 2015-12-31 DIAGNOSIS — M6281 Muscle weakness (generalized): Secondary | ICD-10-CM | POA: Diagnosis not present

## 2015-12-31 DIAGNOSIS — R2689 Other abnormalities of gait and mobility: Secondary | ICD-10-CM | POA: Diagnosis not present

## 2016-01-03 DIAGNOSIS — R2689 Other abnormalities of gait and mobility: Secondary | ICD-10-CM | POA: Diagnosis not present

## 2016-01-03 DIAGNOSIS — M6281 Muscle weakness (generalized): Secondary | ICD-10-CM | POA: Diagnosis not present

## 2016-01-04 DIAGNOSIS — R2689 Other abnormalities of gait and mobility: Secondary | ICD-10-CM | POA: Diagnosis not present

## 2016-01-04 DIAGNOSIS — M6281 Muscle weakness (generalized): Secondary | ICD-10-CM | POA: Diagnosis not present

## 2016-01-05 DIAGNOSIS — Z79899 Other long term (current) drug therapy: Secondary | ICD-10-CM | POA: Diagnosis not present

## 2016-01-05 DIAGNOSIS — M6281 Muscle weakness (generalized): Secondary | ICD-10-CM | POA: Diagnosis not present

## 2016-01-05 DIAGNOSIS — R2689 Other abnormalities of gait and mobility: Secondary | ICD-10-CM | POA: Diagnosis not present

## 2016-01-05 LAB — BASIC METABOLIC PANEL
BUN: 12 mg/dL (ref 4–21)
CREATININE: 0.5 mg/dL (ref 0.5–1.1)
Glucose: 93 mg/dL
POTASSIUM: 4 mmol/L (ref 3.4–5.3)
Sodium: 134 mmol/L — AB (ref 137–147)

## 2016-01-05 LAB — CBC AND DIFFERENTIAL
HCT: 30 % — AB (ref 36–46)
Hemoglobin: 9.5 g/dL — AB (ref 12.0–16.0)
PLATELETS: 294 10*3/uL (ref 150–399)
WBC: 2.5 10*3/mL

## 2016-01-06 ENCOUNTER — Encounter: Payer: Self-pay | Admitting: Family

## 2016-01-06 ENCOUNTER — Non-Acute Institutional Stay (SKILLED_NURSING_FACILITY): Payer: Medicare Other | Admitting: Family

## 2016-01-06 DIAGNOSIS — E46 Unspecified protein-calorie malnutrition: Secondary | ICD-10-CM

## 2016-01-06 DIAGNOSIS — E039 Hypothyroidism, unspecified: Secondary | ICD-10-CM | POA: Diagnosis not present

## 2016-01-06 DIAGNOSIS — D649 Anemia, unspecified: Secondary | ICD-10-CM

## 2016-01-06 DIAGNOSIS — R2689 Other abnormalities of gait and mobility: Secondary | ICD-10-CM | POA: Diagnosis not present

## 2016-01-06 DIAGNOSIS — S52202K Unspecified fracture of shaft of left ulna, subsequent encounter for closed fracture with nonunion: Secondary | ICD-10-CM

## 2016-01-06 DIAGNOSIS — M6281 Muscle weakness (generalized): Secondary | ICD-10-CM | POA: Diagnosis not present

## 2016-01-06 NOTE — Progress Notes (Signed)
Patient ID: Maria Walker, female   DOB: April 05, 1927, 82 y.o.   MRN: [id number]  Location:   [facility]  Nursing Home Room Number: 601 Place of Service:  SNF [phone]  Provider: Richarda Bladeinah Mariana Wiederholt FNP-C   PCP: Tillman Abideichard Letvak, MD Patient Care Team: Karie Schwalbeichard I Letvak, MD as PCP - General (Internal Medicine)  Extended Emergency Contact Information Primary Emergency Contact: Clance BollNorfleet,Pamela  [place] Home Phone: [phone] Mobile Phone: [phone] Relation: Daughter Secondary Emergency Contact: Copeland,Edna S Address: [address]          [place], [place] [address] [place] Home Phone: [phone] Relation: Sister  Code Status: DNR  Goals of care:  Advanced Directive information Advanced Directives 01/06/2016  Does patient have an advance directive? Yes  Type of Advance Directive Out of facility DNR (pink MOST or yellow form);Healthcare Power of Attorney  Does patient want to make changes to advanced directive? No - Patient declined  Copy of advanced directive(s) in chart? Yes  Would patient like information on creating an advanced directive? -     Allergies  Allergen Reactions  . Codeine Other (See Comments)    Reaction:  Hallucinations     Chief Complaint  Patient presents with  . Discharge Note    HPI:  82 y.o. female seen today at [facility] for discharge home. She has a medical history of Hypothyroidism, chronic lung disease among others. She was here for short term rehabilitation post ED visit with left arm pain post left ulnar nondisplaced fracture. She had a splint placed.She was seen by ortho 12/30/2015 no Supination/ Pronation of left wrist. She has worked well with PT/OT now stable for discharge home.She will be discharged home with Home health PT/OT to continue with ROM, Exercise, Gait stability and muscle strengthening. She does not require any DME states has own walker.Home health services will be arranged  by facility social worker prior to discharge. Prescription medication will be written x 1 month then patient to follow up with PCP in 1-2 weeks. Facility staff report no new concerns.       Past Medical History:  Diagnosis Date  . Apical lung scarring   . Chronic lung disease    from past pneumonia  . Hyponatremia    with cessation of thyroid replacement  . Hypothyroidism    after goiter surgery    Past Surgical History:  Procedure Laterality Date  . APPENDECTOMY    . HIP SURGERY  2014   Left partial hip  . THYROIDECTOMY, PARTIAL  1948   goiter      reports that she has never smoked. She has never used smokeless tobacco. She reports that she does not drink alcohol or use drugs. Social History   Social History  . Marital status: Widowed    Spouse name: N/A  . Number of children: 3  . Years of education: N/A   Occupational History  . Office work     Retired   Social History Main Topics  . Smoking status: Never Smoker  . Smokeless tobacco: Never Used  . Alcohol use No  . Drug use: No  . Sexual activity: Not on file   Other Topics Concern  . Not on file   Social History Narrative   Widowed 1989      No living will   [name] should be her health care POA   Has DNR already   No tube feeds if cognitively  unaware   Functional Status Survey:    Allergies  Allergen Reactions  . Codeine Other (See Comments)    Reaction:  Hallucinations     Pertinent  Health Maintenance Due  Topic Date Due  . DEXA SCAN  10/17/1993  . PNA vac Low Risk Adult (2 of 2 - PCV13) 05/23/1995  . INFLUENZA VACCINE  02/20/2016 (Originally 12/21/2015)    Medications:   Medication List       Accurate as of 01/06/16  3:07 PM. Always use your most recent med list.          acetaminophen 325 MG tablet Commonly known as:  TYLENOL Take 650 mg by mouth every 4 (four) hours as needed for mild pain.   calcium-vitamin D 500-200 MG-UNIT tablet Commonly known as:  OSCAL WITH  D Take 1 tablet by mouth 2 (two) times daily.   CENTRUM SILVER PO Take 1 tablet by mouth daily.   levothyroxine 100 MCG tablet Commonly known as:  SYNTHROID, LEVOTHROID Take 1 tablet (100 mcg total) by mouth daily before breakfast.   SYSTANE 0.4-0.3 % Gel ophthalmic gel Generic drug:  Polyethyl Glycol-Propyl Glycol Place 1 application into both eyes 3 (three) times daily as needed.       Review of Systems  Constitutional: Negative for activity change, appetite change, chills, fatigue and fever.  HENT: Negative for congestion, rhinorrhea, sinus pressure, sneezing and sore throat.   Eyes: Negative.   Respiratory: Negative for cough, chest tightness, shortness of breath and wheezing.   Cardiovascular: Negative for chest pain, palpitations and leg swelling.  Gastrointestinal: Negative for abdominal distention, abdominal pain, constipation, diarrhea, nausea and vomiting.  Endocrine: Negative.   Genitourinary: Negative for dysuria, flank pain and urgency.  Musculoskeletal: Positive for gait problem.       Left arm wrist splint   Skin: Negative for color change, pallor and rash.  Neurological: Negative for dizziness, seizures, syncope, light-headedness and headaches.  Psychiatric/Behavioral: Negative for agitation, confusion, hallucinations and sleep disturbance. The patient is not nervous/anxious.     Vitals:   01/06/16 0851  BP: (!) 118/58  Resp: 20  Temp: 98 F (36.7 C)  TempSrc: Oral  SpO2: 96%  Weight: 94 lb 3.2 oz (42.7 kg)  Height: 5\' 4"  (1.626 m)   Body mass index is 16.17 kg/m. Physical Exam  Constitutional: She is oriented to person, place, and time. She appears well-developed.   Elderly in no acute distress   HENT:  Head: Normocephalic.  Mouth/Throat: Oropharynx is clear and moist. No oropharyngeal exudate.  Eyes: Conjunctivae and EOM are normal. Pupils are equal, round, and reactive to light. Right eye exhibits no discharge. Left eye exhibits no discharge. No  scleral icterus.  Neck: Normal range of motion. No JVD present. No thyromegaly present.  Cardiovascular: Normal rate, regular rhythm, normal heart sounds and intact distal pulses.  Exam reveals no gallop and no friction rub.   No murmur heard. Pulmonary/Chest: Effort normal and breath sounds normal. No respiratory distress. She has no wheezes. She has no rales.  Abdominal: Soft. Bowel sounds are normal. She exhibits no distension. There is no tenderness. There is no rebound and no guarding.  Musculoskeletal: She exhibits no tenderness or deformity.  Normal ROM except left Arm wrist splint in place. Bilateral Lower extremities 1-2 + edema.   Lymphadenopathy:    She has no cervical adenopathy.  Neurological: She is oriented to person, place, and time.  Skin: Skin is warm and dry. No rash noted. No erythema.  No pallor.  Psychiatric: She has a normal mood and affect.    Labs reviewed: Basic Metabolic Panel:  Recent Labs  40/98/11 0743 07/28/15 1308 12/16/15 2152 12/21/15 01/05/16  NA 134* 131* 131* 132* 134*  K 3.3* 3.9 3.3* 3.9 4.0  CL 105 94* 95*  --   --   CO2 27 28 28   --   --   GLUCOSE 92 96 149*  --   --   BUN 6 10 8 11 12   CREATININE 0.71 0.64 0.60 0.5 0.5  CALCIUM 7.6* 9.1 8.6*  --   --   PHOS  --  3.9  --   --   --    Liver Function Tests:  Recent Labs  07/02/15 1225 07/28/15 1308 12/16/15 2152 12/21/15  AST 73*  --  33 21  ALT 16  --  16 11  ALKPHOS 64  --  66 53  BILITOT 1.3*  --  0.8  --   PROT 8.6*  --  7.7  --   ALBUMIN 4.1 4.0 3.9  --    No results for input(s): LIPASE, AMYLASE in the last 8760 hours. No results for input(s): AMMONIA in the last 8760 hours. CBC:  Recent Labs  07/02/15 1225 07/03/15 0406 12/16/15 2152 12/21/15 01/05/16  WBC 5.7 4.6 7.0 3.4 2.5  NEUTROABS 4.7  --  5.8  --   --   HGB 13.7 11.6* 11.8* 9.8* 9.5*  HCT 41.5 34.6* 35.1 30* 30*  MCV 87.3 86.3 86.6  --   --   PLT 258 214 197 174 294   Cardiac Enzymes:  Recent Labs   07/02/15 1642 07/02/15 2239 07/03/15 0406  CKTOTAL 1,608*  --  1,086*  TROPONINI 0.34* 0.31* 0.27*   Assessment/Plan:   Ulnar Fracture  S/p short term rehabilitation post ED visit with left arm pain post left ulnar nondisplaced fracture. Has a left wrist splint.She was seen by ortho 12/30/2015 recommend no Supination/ Pronation of left wrist.will be discharge home with Home health PT/OT to continue with ROM, Exercise, Gait stability and muscle strengthening.  Hypothyroidism  Continue on Levothyroxine 100 mcg Tablet. TSH level with PCP   Protein Malnutrition  Continue on Med pass. BMP in 1-2 weeks with PCP     Anemia  Recent Hgb 9.8. CBC in 1-2 weeks with PCP.   Abnormal gait  Discharge home with PT/OT to continue with ROM, Exercise, Gait stability and muscle strengthening. She does not require any DME states has own walker.  Patient is being discharged with the following home health services:   PT/OT to continue with ROM, Exercise, Gait stability and muscle strengthening.   Patient is being discharged with the following durable medical equipment:  No DME required states has own walker.   Patient has been advised to f/u with their PCP in 1-2 weeks to bring them up to date on their rehab stay.  Social services at facility was responsible for arranging this appointment.  Pt was provided with a 30 day supply of prescriptions for medications and refills must be obtained from their PCP.  For controlled substances, a more limited supply may be provided adequate until PCP appointment only.  Future labs/tests needed: CBC, BMP in 1-2 weeks with PCP

## 2016-01-15 DIAGNOSIS — E441 Mild protein-calorie malnutrition: Secondary | ICD-10-CM | POA: Diagnosis not present

## 2016-01-15 DIAGNOSIS — S59002D Unspecified physeal fracture of lower end of ulna, left arm, subsequent encounter for fracture with routine healing: Secondary | ICD-10-CM | POA: Diagnosis not present

## 2016-01-15 DIAGNOSIS — J984 Other disorders of lung: Secondary | ICD-10-CM | POA: Diagnosis not present

## 2016-01-15 DIAGNOSIS — R296 Repeated falls: Secondary | ICD-10-CM | POA: Diagnosis not present

## 2016-01-15 DIAGNOSIS — W19XXXD Unspecified fall, subsequent encounter: Secondary | ICD-10-CM | POA: Diagnosis not present

## 2016-01-15 DIAGNOSIS — M6281 Muscle weakness (generalized): Secondary | ICD-10-CM | POA: Diagnosis not present

## 2016-01-17 ENCOUNTER — Ambulatory Visit: Payer: Medicare Other | Admitting: Internal Medicine

## 2016-01-17 ENCOUNTER — Telehealth: Payer: Self-pay | Admitting: Internal Medicine

## 2016-01-17 NOTE — Telephone Encounter (Signed)
Cala BradfordKimberly from [facility] called to request home health PT  2 times a week for 6 weeks cb number is [phone]

## 2016-01-17 NOTE — Telephone Encounter (Signed)
Okay to proceed See if she has follow up planned (was in a different rehab)

## 2016-01-17 NOTE — Telephone Encounter (Signed)
Left a detailed message on Maria Walker's vm with PT Orders.  Left message at home number to see about making a ov for Rehab F/U

## 2016-01-18 DIAGNOSIS — M6281 Muscle weakness (generalized): Secondary | ICD-10-CM | POA: Diagnosis not present

## 2016-01-18 DIAGNOSIS — E441 Mild protein-calorie malnutrition: Secondary | ICD-10-CM | POA: Diagnosis not present

## 2016-01-18 DIAGNOSIS — W19XXXD Unspecified fall, subsequent encounter: Secondary | ICD-10-CM | POA: Diagnosis not present

## 2016-01-18 DIAGNOSIS — R296 Repeated falls: Secondary | ICD-10-CM | POA: Diagnosis not present

## 2016-01-18 DIAGNOSIS — S59002D Unspecified physeal fracture of lower end of ulna, left arm, subsequent encounter for fracture with routine healing: Secondary | ICD-10-CM | POA: Diagnosis not present

## 2016-01-18 DIAGNOSIS — J984 Other disorders of lung: Secondary | ICD-10-CM | POA: Diagnosis not present

## 2016-01-19 DIAGNOSIS — E441 Mild protein-calorie malnutrition: Secondary | ICD-10-CM | POA: Diagnosis not present

## 2016-01-19 DIAGNOSIS — J984 Other disorders of lung: Secondary | ICD-10-CM | POA: Diagnosis not present

## 2016-01-19 DIAGNOSIS — W19XXXD Unspecified fall, subsequent encounter: Secondary | ICD-10-CM | POA: Diagnosis not present

## 2016-01-19 DIAGNOSIS — M6281 Muscle weakness (generalized): Secondary | ICD-10-CM | POA: Diagnosis not present

## 2016-01-19 DIAGNOSIS — R296 Repeated falls: Secondary | ICD-10-CM | POA: Diagnosis not present

## 2016-01-19 DIAGNOSIS — S59002D Unspecified physeal fracture of lower end of ulna, left arm, subsequent encounter for fracture with routine healing: Secondary | ICD-10-CM | POA: Diagnosis not present

## 2016-01-19 NOTE — Telephone Encounter (Signed)
Okay to wait till September

## 2016-01-19 NOTE — Telephone Encounter (Signed)
Spoke to pt. She said it is really hard for her to get out and move around. She has an appt with Virl AxeLesia in September. She asked is it necessary for her to come in and f/u on Rehab?

## 2016-01-20 DIAGNOSIS — S59002D Unspecified physeal fracture of lower end of ulna, left arm, subsequent encounter for fracture with routine healing: Secondary | ICD-10-CM | POA: Diagnosis not present

## 2016-01-20 DIAGNOSIS — J984 Other disorders of lung: Secondary | ICD-10-CM | POA: Diagnosis not present

## 2016-01-20 DIAGNOSIS — E441 Mild protein-calorie malnutrition: Secondary | ICD-10-CM | POA: Diagnosis not present

## 2016-01-20 DIAGNOSIS — W19XXXD Unspecified fall, subsequent encounter: Secondary | ICD-10-CM | POA: Diagnosis not present

## 2016-01-20 DIAGNOSIS — M6281 Muscle weakness (generalized): Secondary | ICD-10-CM | POA: Diagnosis not present

## 2016-01-20 DIAGNOSIS — R296 Repeated falls: Secondary | ICD-10-CM | POA: Diagnosis not present

## 2016-01-21 ENCOUNTER — Telehealth: Payer: Self-pay

## 2016-01-21 NOTE — Telephone Encounter (Signed)
Junious Dresseronnie, OT with [facility] advised.

## 2016-01-21 NOTE — Telephone Encounter (Signed)
Junious Dresseronnie OT with [facility] left v/m requesting verbal orders for home health OT 2 x a week for 6 weeks.

## 2016-01-21 NOTE — Telephone Encounter (Signed)
Please give the order.  Thanks.   

## 2016-01-26 DIAGNOSIS — R296 Repeated falls: Secondary | ICD-10-CM | POA: Diagnosis not present

## 2016-01-26 DIAGNOSIS — J984 Other disorders of lung: Secondary | ICD-10-CM | POA: Diagnosis not present

## 2016-01-26 DIAGNOSIS — S59002D Unspecified physeal fracture of lower end of ulna, left arm, subsequent encounter for fracture with routine healing: Secondary | ICD-10-CM | POA: Diagnosis not present

## 2016-01-26 DIAGNOSIS — M6281 Muscle weakness (generalized): Secondary | ICD-10-CM | POA: Diagnosis not present

## 2016-01-26 DIAGNOSIS — E441 Mild protein-calorie malnutrition: Secondary | ICD-10-CM | POA: Diagnosis not present

## 2016-01-26 DIAGNOSIS — W19XXXD Unspecified fall, subsequent encounter: Secondary | ICD-10-CM | POA: Diagnosis not present

## 2016-01-27 DIAGNOSIS — J984 Other disorders of lung: Secondary | ICD-10-CM | POA: Diagnosis not present

## 2016-01-27 DIAGNOSIS — W19XXXD Unspecified fall, subsequent encounter: Secondary | ICD-10-CM | POA: Diagnosis not present

## 2016-01-27 DIAGNOSIS — R296 Repeated falls: Secondary | ICD-10-CM | POA: Diagnosis not present

## 2016-01-27 DIAGNOSIS — M6281 Muscle weakness (generalized): Secondary | ICD-10-CM | POA: Diagnosis not present

## 2016-01-27 DIAGNOSIS — E441 Mild protein-calorie malnutrition: Secondary | ICD-10-CM | POA: Diagnosis not present

## 2016-01-27 DIAGNOSIS — S59002D Unspecified physeal fracture of lower end of ulna, left arm, subsequent encounter for fracture with routine healing: Secondary | ICD-10-CM | POA: Diagnosis not present

## 2016-01-28 DIAGNOSIS — E441 Mild protein-calorie malnutrition: Secondary | ICD-10-CM | POA: Diagnosis not present

## 2016-01-28 DIAGNOSIS — M6281 Muscle weakness (generalized): Secondary | ICD-10-CM | POA: Diagnosis not present

## 2016-01-28 DIAGNOSIS — W19XXXD Unspecified fall, subsequent encounter: Secondary | ICD-10-CM | POA: Diagnosis not present

## 2016-01-28 DIAGNOSIS — J984 Other disorders of lung: Secondary | ICD-10-CM | POA: Diagnosis not present

## 2016-01-28 DIAGNOSIS — S59002D Unspecified physeal fracture of lower end of ulna, left arm, subsequent encounter for fracture with routine healing: Secondary | ICD-10-CM | POA: Diagnosis not present

## 2016-01-28 DIAGNOSIS — R296 Repeated falls: Secondary | ICD-10-CM | POA: Diagnosis not present

## 2016-02-01 DIAGNOSIS — E441 Mild protein-calorie malnutrition: Secondary | ICD-10-CM | POA: Diagnosis not present

## 2016-02-01 DIAGNOSIS — W19XXXD Unspecified fall, subsequent encounter: Secondary | ICD-10-CM | POA: Diagnosis not present

## 2016-02-01 DIAGNOSIS — J984 Other disorders of lung: Secondary | ICD-10-CM | POA: Diagnosis not present

## 2016-02-01 DIAGNOSIS — M6281 Muscle weakness (generalized): Secondary | ICD-10-CM | POA: Diagnosis not present

## 2016-02-01 DIAGNOSIS — S59002D Unspecified physeal fracture of lower end of ulna, left arm, subsequent encounter for fracture with routine healing: Secondary | ICD-10-CM | POA: Diagnosis not present

## 2016-02-01 DIAGNOSIS — R296 Repeated falls: Secondary | ICD-10-CM | POA: Diagnosis not present

## 2016-02-02 DIAGNOSIS — J984 Other disorders of lung: Secondary | ICD-10-CM | POA: Diagnosis not present

## 2016-02-02 DIAGNOSIS — E441 Mild protein-calorie malnutrition: Secondary | ICD-10-CM | POA: Diagnosis not present

## 2016-02-02 DIAGNOSIS — M6281 Muscle weakness (generalized): Secondary | ICD-10-CM | POA: Diagnosis not present

## 2016-02-02 DIAGNOSIS — S59002D Unspecified physeal fracture of lower end of ulna, left arm, subsequent encounter for fracture with routine healing: Secondary | ICD-10-CM | POA: Diagnosis not present

## 2016-02-02 DIAGNOSIS — W19XXXD Unspecified fall, subsequent encounter: Secondary | ICD-10-CM | POA: Diagnosis not present

## 2016-02-02 DIAGNOSIS — R296 Repeated falls: Secondary | ICD-10-CM | POA: Diagnosis not present

## 2016-02-03 DIAGNOSIS — S59002D Unspecified physeal fracture of lower end of ulna, left arm, subsequent encounter for fracture with routine healing: Secondary | ICD-10-CM | POA: Diagnosis not present

## 2016-02-03 DIAGNOSIS — W19XXXD Unspecified fall, subsequent encounter: Secondary | ICD-10-CM | POA: Diagnosis not present

## 2016-02-03 DIAGNOSIS — E441 Mild protein-calorie malnutrition: Secondary | ICD-10-CM | POA: Diagnosis not present

## 2016-02-03 DIAGNOSIS — M6281 Muscle weakness (generalized): Secondary | ICD-10-CM | POA: Diagnosis not present

## 2016-02-03 DIAGNOSIS — J984 Other disorders of lung: Secondary | ICD-10-CM | POA: Diagnosis not present

## 2016-02-03 DIAGNOSIS — R296 Repeated falls: Secondary | ICD-10-CM | POA: Diagnosis not present

## 2016-02-07 ENCOUNTER — Ambulatory Visit (INDEPENDENT_AMBULATORY_CARE_PROVIDER_SITE_OTHER)
Admission: RE | Admit: 2016-02-07 | Discharge: 2016-02-07 | Disposition: A | Discharge status: Home / Self Care | Payer: Medicare Other | Source: Ambulatory Visit | Attending: Internal Medicine | Admitting: Internal Medicine

## 2016-02-07 ENCOUNTER — Ambulatory Visit (INDEPENDENT_AMBULATORY_CARE_PROVIDER_SITE_OTHER): Payer: Medicare Other | Admitting: Internal Medicine

## 2016-02-07 ENCOUNTER — Encounter: Payer: Self-pay | Admitting: Internal Medicine

## 2016-02-07 VITALS — BP 142/84 | HR 93 | Temp 98.0°F | Wt 94.0 lb

## 2016-02-07 DIAGNOSIS — Z0000 Encounter for general adult medical examination without abnormal findings: Secondary | ICD-10-CM | POA: Insufficient documentation

## 2016-02-07 DIAGNOSIS — S62102D Fracture of unspecified carpal bone, left wrist, subsequent encounter for fracture with routine healing: Secondary | ICD-10-CM | POA: Diagnosis not present

## 2016-02-07 DIAGNOSIS — M810 Age-related osteoporosis without current pathological fracture: Secondary | ICD-10-CM

## 2016-02-07 DIAGNOSIS — Z7189 Other specified counseling: Secondary | ICD-10-CM

## 2016-02-07 DIAGNOSIS — S52692D Other fracture of lower end of left ulna, subsequent encounter for closed fracture with routine healing: Secondary | ICD-10-CM | POA: Diagnosis not present

## 2016-02-07 DIAGNOSIS — Z23 Encounter for immunization: Secondary | ICD-10-CM | POA: Diagnosis not present

## 2016-02-07 DIAGNOSIS — E039 Hypothyroidism, unspecified: Secondary | ICD-10-CM

## 2016-02-07 DIAGNOSIS — E441 Mild protein-calorie malnutrition: Secondary | ICD-10-CM

## 2016-02-07 DIAGNOSIS — R296 Repeated falls: Secondary | ICD-10-CM | POA: Diagnosis not present

## 2016-02-07 DIAGNOSIS — J984 Other disorders of lung: Secondary | ICD-10-CM | POA: Diagnosis not present

## 2016-02-07 DIAGNOSIS — M6281 Muscle weakness (generalized): Secondary | ICD-10-CM | POA: Diagnosis not present

## 2016-02-07 DIAGNOSIS — S59002D Unspecified physeal fracture of lower end of ulna, left arm, subsequent encounter for fracture with routine healing: Secondary | ICD-10-CM | POA: Diagnosis not present

## 2016-02-07 DIAGNOSIS — W19XXXD Unspecified fall, subsequent encounter: Secondary | ICD-10-CM | POA: Diagnosis not present

## 2016-02-07 MED ORDER — ALENDRONATE SODIUM 70 MG PO TABS: 70.0000 mg | ORAL_TABLET | ORAL | 11 refills | Status: DC

## 2016-02-07 NOTE — Addendum Note (Signed)
Addended by: Eual FinesBRIDGES, SHANNON P on: 02/07/2016 06:02 PM   Modules accepted: Orders

## 2016-02-07 NOTE — Assessment & Plan Note (Signed)
I have personally reviewed the Medicare Annual Wellness questionnaire and have noted 1. The patient's medical and social history 2. Their use of alcohol, tobacco or illicit drugs 3. Their current medications and supplements 4. The patient's functional ability including ADL's, fall risks, home safety risks and hearing or visual             impairment. 5. Diet and physical activities 6. Evidence for depression or mood disorders  The patients weight, height, BMI and visual acuity have been recorded in the chart I have made referrals, counseling and provided education to the patient based review of the above and I have provided the pt with a written personalized care plan for preventive services.  I have provided you with a copy of your personalized plan for preventive services. Please take the time to review along with your updated medication list.  No cancer screening due to age prevnar today with flu vaccine

## 2016-02-07 NOTE — Assessment & Plan Note (Signed)
Seems to euthyroid

## 2016-02-07 NOTE — Assessment & Plan Note (Signed)
On calcium and vitamin D Will see if she can tolerate fosamax

## 2016-02-07 NOTE — Assessment & Plan Note (Signed)
Lost weight in rehab They are working on her intake, etc

## 2016-02-07 NOTE — Assessment & Plan Note (Signed)
DNR reaffirmed New form given

## 2016-02-07 NOTE — Assessment & Plan Note (Signed)
Doing well now Needs follow up x-ray

## 2016-02-07 NOTE — Progress Notes (Signed)
Subjective:    Patient ID: Maria Walker, female    DOB: [birth year], 82 y.o.   MRN: [id number]  HPI Here with daughter for initial  Medicare wellness and follow up of chronic medical conditions Reviewed form and advanced directives No other doctors-- other than eye doctor No exercise Vision and hearing are okay Has fallen and with injury No depression or anhedonia Needs assistance with ADLS Memory is fine--per her. Daughter notes some changes  Recent left wrist fracture Home from rehab for 1 month Home PT/OT AM aide helps her exercise  Now has caregiver at home 24 hour care --now down to 4 hours. 8AM to noon Help her with shower, cooking food, etc Daughter was going at night but then doesn't have to because DIL now staying with her--- [place]  Left wrist is better Has splint--but not wearing it all the time Needs follow up x-ray  Weight is down 9# since her fracture Doesn't tolerate boost or ensure Hits big breakfast-- then not that hungry Will eat nabs, ice cream, etc  Current Outpatient Prescriptions on File Prior to Visit  Medication Sig Dispense Refill  . levothyroxine (SYNTHROID, LEVOTHROID) 100 MCG tablet Take 1 tablet (100 mcg total) by mouth daily before breakfast. 90 tablet 3  . Multiple Vitamins-Minerals (CENTRUM SILVER PO) Take 1 tablet by mouth daily.     No current facility-administered medications on file prior to visit.     Allergies  Allergen Reactions  . Codeine Other (See Comments)    Reaction:  Hallucinations     Past Medical History:  Diagnosis Date  . Apical lung scarring   . Chronic lung disease    from past pneumonia  . Hyponatremia    with cessation of thyroid replacement  . Hypothyroidism    after goiter surgery    Past Surgical History:  Procedure Laterality Date  . APPENDECTOMY    . HIP SURGERY  2014   Left partial hip  . THYROIDECTOMY, PARTIAL  1948   goiter    Family History  Problem Relation Age of  Onset  . Hypertension           Social History   Social History  . Marital status: Widowed    Spouse name: Maria Walker  . Number of children: 3  . Years of education: Maria Walker   Occupational History  . Office work     Retired   Social History Main Topics  . Smoking status: Never Smoker  . Smokeless tobacco: Never Used  . Alcohol use No  . Drug use: No  . Sexual activity: Not on file   Other Topics Concern  . Not on file   Social History Narrative   Widowed 1989      No living will   [name] is her health care POA   Has DNR already   No tube feeds if cognitively unaware   Review of Systems Full dentures--no mouth problems Sleeps okay Bowels are okay No skin lesions No urinary problems No chest pain  No SOB No dizziness    Objective:   Physical Exam  Constitutional: She appears well-developed. No distress.  Neck: Normal range of motion. Neck supple. No thyromegaly present.  Cardiovascular: Normal rate, regular rhythm and normal heart sounds.  Exam reveals no gallop.   No murmur heard. Pulmonary/Chest: Effort normal and breath sounds normal. No respiratory distress. She has no wheezes. She has no rales.  Abdominal: Soft. There is no tenderness.  Musculoskeletal: She exhibits no edema or tenderness.  Lymphadenopathy:    She has no cervical adenopathy.  Neurological: She is alert.  "November or October 2017" President-- "Trump, Bush----- ?" 100-93-"good night" D-l-r-o-w Recall 3/3  Skin: No rash noted. No erythema.  Psychiatric: She has a normal mood and affect. Her behavior is normal.          Assessment & Plan:

## 2016-02-07 NOTE — Progress Notes (Signed)
Pre visit review using our clinic review tool, if applicable. No additional management support is needed unless otherwise documented below in the visit note. 

## 2016-02-08 ENCOUNTER — Ambulatory Visit: Payer: Medicare Other

## 2016-02-09 DIAGNOSIS — J984 Other disorders of lung: Secondary | ICD-10-CM | POA: Diagnosis not present

## 2016-02-09 DIAGNOSIS — S59002D Unspecified physeal fracture of lower end of ulna, left arm, subsequent encounter for fracture with routine healing: Secondary | ICD-10-CM | POA: Diagnosis not present

## 2016-02-09 DIAGNOSIS — R296 Repeated falls: Secondary | ICD-10-CM | POA: Diagnosis not present

## 2016-02-09 DIAGNOSIS — E441 Mild protein-calorie malnutrition: Secondary | ICD-10-CM | POA: Diagnosis not present

## 2016-02-09 DIAGNOSIS — M6281 Muscle weakness (generalized): Secondary | ICD-10-CM | POA: Diagnosis not present

## 2016-02-09 DIAGNOSIS — W19XXXD Unspecified fall, subsequent encounter: Secondary | ICD-10-CM | POA: Diagnosis not present

## 2016-02-10 DIAGNOSIS — J984 Other disorders of lung: Secondary | ICD-10-CM | POA: Diagnosis not present

## 2016-02-10 DIAGNOSIS — S59002D Unspecified physeal fracture of lower end of ulna, left arm, subsequent encounter for fracture with routine healing: Secondary | ICD-10-CM | POA: Diagnosis not present

## 2016-02-10 DIAGNOSIS — R296 Repeated falls: Secondary | ICD-10-CM | POA: Diagnosis not present

## 2016-02-10 DIAGNOSIS — M6281 Muscle weakness (generalized): Secondary | ICD-10-CM | POA: Diagnosis not present

## 2016-02-10 DIAGNOSIS — W19XXXD Unspecified fall, subsequent encounter: Secondary | ICD-10-CM | POA: Diagnosis not present

## 2016-02-10 DIAGNOSIS — E441 Mild protein-calorie malnutrition: Secondary | ICD-10-CM | POA: Diagnosis not present

## 2016-02-14 DIAGNOSIS — M6281 Muscle weakness (generalized): Secondary | ICD-10-CM | POA: Diagnosis not present

## 2016-02-14 DIAGNOSIS — E441 Mild protein-calorie malnutrition: Secondary | ICD-10-CM | POA: Diagnosis not present

## 2016-02-14 DIAGNOSIS — R296 Repeated falls: Secondary | ICD-10-CM | POA: Diagnosis not present

## 2016-02-14 DIAGNOSIS — S59002D Unspecified physeal fracture of lower end of ulna, left arm, subsequent encounter for fracture with routine healing: Secondary | ICD-10-CM | POA: Diagnosis not present

## 2016-02-14 DIAGNOSIS — J984 Other disorders of lung: Secondary | ICD-10-CM | POA: Diagnosis not present

## 2016-02-14 DIAGNOSIS — W19XXXD Unspecified fall, subsequent encounter: Secondary | ICD-10-CM | POA: Diagnosis not present

## 2016-02-15 ENCOUNTER — Telehealth: Payer: Self-pay | Admitting: Internal Medicine

## 2016-02-15 NOTE — Telephone Encounter (Signed)
I will see her then  

## 2016-02-15 NOTE — Telephone Encounter (Signed)
Pt has appt on 02/16/16 at 4:30 with Dr Milinda Antisower.

## 2016-02-15 NOTE — Telephone Encounter (Signed)
[facility] - Client TELEPHONE ADVICE RECORD [facility] Patient Name: Maria OysterDITH Walker DOB: 1926-07-21 Initial Comment Caller states her mother had a flu shot on Monday a week ago, injection site is red. Nurse Assessment Nurse: Debera Latalston, RN, Tinnie GensJeffrey Date/Time Lamount Cohen(Eastern Time): 02/15/2016 11:06:19 AM Confirm and document reason for call. If symptomatic, describe symptoms. You must click the next button to save text entered. ---Caller states her mother had a flu shot on Monday a week ago, injection site is red. First noticed symptoms today. No fever. Left arm. Bigger than a quarter. Having itching as well. Has the patient traveled out of the country within the last 30 days? ---No Does the patient have any new or worsening symptoms? ---Yes Will a triage be completed? ---Yes Related visit to physician within the last 2 weeks? ---Yes Does the PT have any chronic conditions? (i.e. diabetes, asthma, etc.) ---No Is this a behavioral health or substance abuse call? ---No Guidelines Guideline Title Affirmed Question Affirmed Notes Immunization Reactions [1] Redness or red streak around the injection site AND [2] begins > 48 hours after shot AND [3] no fever (Exception: red area < 1 inch or 2.5 cm wide) Final Disposition User See Physician within 24 Hours AmalgaRalston, RN, BB&T CorporationJeffrey Comments Caller could not have patient for appointment in 24 hour window so closest appointment for caller was scheduled. Caller lives 2 and 1/2 hours from mother. Referrals REFERRED TO PCP OFFICE Disagree/Comply: Comply

## 2016-02-16 ENCOUNTER — Ambulatory Visit (INDEPENDENT_AMBULATORY_CARE_PROVIDER_SITE_OTHER): Payer: Medicare Other | Admitting: Family Medicine

## 2016-02-16 ENCOUNTER — Encounter: Payer: Self-pay | Admitting: Family Medicine

## 2016-02-16 VITALS — BP 156/78 | HR 100 | Temp 98.2°F | Wt 94.0 lb

## 2016-02-16 DIAGNOSIS — S59002D Unspecified physeal fracture of lower end of ulna, left arm, subsequent encounter for fracture with routine healing: Secondary | ICD-10-CM | POA: Diagnosis not present

## 2016-02-16 DIAGNOSIS — M6281 Muscle weakness (generalized): Secondary | ICD-10-CM | POA: Diagnosis not present

## 2016-02-16 DIAGNOSIS — J984 Other disorders of lung: Secondary | ICD-10-CM | POA: Diagnosis not present

## 2016-02-16 DIAGNOSIS — R296 Repeated falls: Secondary | ICD-10-CM | POA: Diagnosis not present

## 2016-02-16 DIAGNOSIS — T50Z95A Adverse effect of other vaccines and biological substances, initial encounter: Secondary | ICD-10-CM | POA: Diagnosis not present

## 2016-02-16 DIAGNOSIS — W19XXXD Unspecified fall, subsequent encounter: Secondary | ICD-10-CM | POA: Diagnosis not present

## 2016-02-16 DIAGNOSIS — E441 Mild protein-calorie malnutrition: Secondary | ICD-10-CM | POA: Diagnosis not present

## 2016-02-16 NOTE — Patient Instructions (Signed)
Your arm looks better  Cortisone cream is ok for itching  A cool compress is also ok  If you develop any hives or other symptoms - wheezing or mouth swelling- alert us and get to an emergency room

## 2016-02-16 NOTE — Assessment & Plan Note (Signed)
Local reaction with erythema /swelling and itching that began about a week after 2 imms in the L arm (flu and prevnar) Now much improved  Used cortisone cream last night  No s/s of anaphylaxis (reviewed) I suspect that since she has had flu shots in the past- the prevnar may be what caused the reaction -noted in chart  Re assuring exam  Adv to alert us if symptoms change or worsen

## 2016-02-16 NOTE — Progress Notes (Signed)
Pre visit review using our clinic review tool, if applicable. No additional management support is needed unless otherwise documented below in the visit note. 

## 2016-02-16 NOTE — Progress Notes (Signed)
Subjective:    Patient ID: Maria Walker, female    DOB: 1926/10/14, 82 y.o.   MRN: [id number]  HPI Here for imm reaction from a flu shot  given 02/07/16 in Left arm  Also the prevnar vaccine in the same arm  Given here   The symptoms started a week later  Does not think she had swelling in throat or mouth  No wheeze or trouble breathing   Never felt sick or had a fever   Redness and swelling in arm - warm and uncomfortable  Very itchy   It is actually quite a bit better today   Patient Active Problem List   Diagnosis Date Noted  . Immunization reaction 02/16/2016  . Preventative health care 02/07/2016  . Fracture of left wrist with routine healing 02/07/2016  . Osteoporosis 02/07/2016  . Advance directive discussed with patient 02/07/2016  . Mild malnutrition (HCC) 07/28/2015  . Hypothyroidism   . Chronic lung disease   . Abnormal TSH 07/03/2015  . Hyponatremia 07/02/2015   Past Medical History:  Diagnosis Date  . Apical lung scarring   . Chronic lung disease    from past pneumonia  . Hyponatremia    with cessation of thyroid replacement  . Hypothyroidism    after goiter surgery   Past Surgical History:  Procedure Laterality Date  . APPENDECTOMY    . HIP SURGERY  2014   Left partial hip  . THYROIDECTOMY, PARTIAL  1948   goiter   Social History  Substance Use Topics  . Smoking status: Never Smoker  . Smokeless tobacco: Never Used  . Alcohol use No   Family History  Problem Relation Age of Onset  . Hypertension          Allergies  Allergen Reactions  . Codeine Other (See Comments)    Reaction:  Hallucinations   . Prevnar [Pneumococcal 13-Val Conj Vacc]     Local reaction left arm 9/17    Current Outpatient Prescriptions on File Prior to Visit  Medication Sig Dispense Refill  . alendronate (FOSAMAX) 70 MG tablet Take 1 tablet (70 mg total) by mouth every 7 (seven) days. Take with a full glass of water on an empty stomach. 4 tablet 11  .  levothyroxine (SYNTHROID, LEVOTHROID) 100 MCG tablet Take 1 tablet (100 mcg total) by mouth daily before breakfast. 90 tablet 3  . Multiple Vitamins-Minerals (CENTRUM SILVER PO) Take 1 tablet by mouth daily.     No current facility-administered medications on file prior to visit.     Review of Systems Review of Systems  Constitutional: Negative for fever, appetite change, fatigue and unexpected weight change.  Eyes: Negative for pain and visual disturbance.  Respiratory: Negative for cough and shortness of breath.  neg for wheezing  Cardiovascular: Negative for cp or palpitations    Gastrointestinal: Negative for nausea, diarrhea and constipation.  Genitourinary: Negative for urgency and frequency.  Skin: Negative for pallor or rash  pos for redness and swelling at immunization site , neg for whelps Neurological: Negative for weakness, light-headedness, numbness and headaches.  Hematological: Negative for adenopathy. Does not bruise/bleed easily.  Psychiatric/Behavioral: Negative for dysphoric mood. The patient is not nervous/anxious.         Objective:   Physical Exam  Constitutional: She appears well-developed and well-nourished. No distress.  Frail appearing elderly female in wheelchair  HENT:  Head: Normocephalic and atraumatic.  No facial or lip swelling   Eyes: Conjunctivae and EOM are normal. Pupils  are equal, round, and reactive to light. Right eye exhibits no discharge. Left eye exhibits no discharge.  No eye swelling  Neck: Normal range of motion. Neck supple.  Cardiovascular: Normal rate and regular rhythm.   Pulmonary/Chest: Effort normal and breath sounds normal. She has no wheezes.  Diffusely distant bs  No wheezing  Musculoskeletal: She exhibits no edema.  Lymphadenopathy:    She has no cervical adenopathy.  Neurological: She is alert. No cranial nerve deficit.  Skin: Skin is warm and dry. No rash noted. There is erythema. No pallor.  Mild erythema and induration  of L upper arm around site of immunizations  Slightly warm to the touch Non tender No skin breakdown   Per pt-much improved from yesterday           Assessment & Plan:   Problem List Items Addressed This Visit      Other   Immunization reaction    Local reaction with erythema /swelling and itching that began about a week after 2 imms in the L arm (flu and prevnar) Now much improved  Used cortisone cream last night  No s/s of anaphylaxis (reviewed) I suspect that since she has had flu shots in the past- the prevnar may be what caused the reaction -noted in chart  Re assuring exam  Adv to alert us if symptoms change or worsen        Other Visit Diagnoses   None.

## 2016-02-17 DIAGNOSIS — S59002D Unspecified physeal fracture of lower end of ulna, left arm, subsequent encounter for fracture with routine healing: Secondary | ICD-10-CM | POA: Diagnosis not present

## 2016-02-17 DIAGNOSIS — M6281 Muscle weakness (generalized): Secondary | ICD-10-CM | POA: Diagnosis not present

## 2016-02-17 DIAGNOSIS — R296 Repeated falls: Secondary | ICD-10-CM | POA: Diagnosis not present

## 2016-02-17 DIAGNOSIS — J984 Other disorders of lung: Secondary | ICD-10-CM | POA: Diagnosis not present

## 2016-02-17 DIAGNOSIS — W19XXXD Unspecified fall, subsequent encounter: Secondary | ICD-10-CM | POA: Diagnosis not present

## 2016-02-17 DIAGNOSIS — E441 Mild protein-calorie malnutrition: Secondary | ICD-10-CM | POA: Diagnosis not present

## 2016-02-22 DIAGNOSIS — M6281 Muscle weakness (generalized): Secondary | ICD-10-CM | POA: Diagnosis not present

## 2016-02-22 DIAGNOSIS — E441 Mild protein-calorie malnutrition: Secondary | ICD-10-CM | POA: Diagnosis not present

## 2016-02-22 DIAGNOSIS — J984 Other disorders of lung: Secondary | ICD-10-CM | POA: Diagnosis not present

## 2016-02-22 DIAGNOSIS — W19XXXD Unspecified fall, subsequent encounter: Secondary | ICD-10-CM | POA: Diagnosis not present

## 2016-02-22 DIAGNOSIS — S59002D Unspecified physeal fracture of lower end of ulna, left arm, subsequent encounter for fracture with routine healing: Secondary | ICD-10-CM | POA: Diagnosis not present

## 2016-02-22 DIAGNOSIS — R296 Repeated falls: Secondary | ICD-10-CM | POA: Diagnosis not present

## 2016-02-25 ENCOUNTER — Telehealth: Payer: Self-pay

## 2016-02-25 DIAGNOSIS — W19XXXD Unspecified fall, subsequent encounter: Secondary | ICD-10-CM | POA: Diagnosis not present

## 2016-02-25 DIAGNOSIS — E441 Mild protein-calorie malnutrition: Secondary | ICD-10-CM | POA: Diagnosis not present

## 2016-02-25 DIAGNOSIS — M6281 Muscle weakness (generalized): Secondary | ICD-10-CM | POA: Diagnosis not present

## 2016-02-25 DIAGNOSIS — S59002D Unspecified physeal fracture of lower end of ulna, left arm, subsequent encounter for fracture with routine healing: Secondary | ICD-10-CM | POA: Diagnosis not present

## 2016-02-25 DIAGNOSIS — J984 Other disorders of lung: Secondary | ICD-10-CM | POA: Diagnosis not present

## 2016-02-25 DIAGNOSIS — R296 Repeated falls: Secondary | ICD-10-CM | POA: Diagnosis not present

## 2016-02-25 NOTE — Telephone Encounter (Signed)
Spoke to Bella VillaKristyn. She said the pt has no shortness of breath or chest pain. She mainly has 2+ pitting edema for 1 week. She wanted to know what Dr Alphonsus SiasLetvak thought about compression hoses for her.

## 2016-02-25 NOTE — Telephone Encounter (Signed)
Kristen PT with [facility] left v/m; pt having bilateral increased swelling in both legs, pitting edema from knee and down leg. Pt keeping legs elevated without relief of swelling, Kristen request cb with possible order for med to help with swelling.

## 2016-02-25 NOTE — Telephone Encounter (Signed)
Spoke to Safeway IncPam. She will hold off on the alendronate for a few weeks.

## 2016-02-25 NOTE — Telephone Encounter (Signed)
Pam (POA) said that pt has not been elevating her legs like she should but Pam is going to try to keep her legs elevated. Pam also wanted to know if alendronate could cause the swelling because swelling began about the time the alendronate was started. Pam request cb.

## 2016-02-25 NOTE — Telephone Encounter (Signed)
I am not excited about prescribing a diuretic for her--may increase her fall risk. Speak to the patient and find out how bad the swelling is and whether she has pain or SOB. May need to be seen again if she is having significant problems with this

## 2016-02-25 NOTE — Telephone Encounter (Signed)
That is a rare potential side effect. She can try not taking it for a couple of weeks and see what happens. If the swelling is better, she could restart the alendronate--but perhaps try taking it only every other week to see if that doesn't cause the swelling

## 2016-02-29 DIAGNOSIS — E441 Mild protein-calorie malnutrition: Secondary | ICD-10-CM | POA: Diagnosis not present

## 2016-02-29 DIAGNOSIS — S59002D Unspecified physeal fracture of lower end of ulna, left arm, subsequent encounter for fracture with routine healing: Secondary | ICD-10-CM | POA: Diagnosis not present

## 2016-02-29 DIAGNOSIS — J984 Other disorders of lung: Secondary | ICD-10-CM | POA: Diagnosis not present

## 2016-02-29 DIAGNOSIS — R296 Repeated falls: Secondary | ICD-10-CM | POA: Diagnosis not present

## 2016-02-29 DIAGNOSIS — M6281 Muscle weakness (generalized): Secondary | ICD-10-CM | POA: Diagnosis not present

## 2016-02-29 DIAGNOSIS — W19XXXD Unspecified fall, subsequent encounter: Secondary | ICD-10-CM | POA: Diagnosis not present

## 2016-03-02 DIAGNOSIS — J984 Other disorders of lung: Secondary | ICD-10-CM | POA: Diagnosis not present

## 2016-03-02 DIAGNOSIS — W19XXXD Unspecified fall, subsequent encounter: Secondary | ICD-10-CM | POA: Diagnosis not present

## 2016-03-02 DIAGNOSIS — R296 Repeated falls: Secondary | ICD-10-CM | POA: Diagnosis not present

## 2016-03-02 DIAGNOSIS — M6281 Muscle weakness (generalized): Secondary | ICD-10-CM | POA: Diagnosis not present

## 2016-03-02 DIAGNOSIS — S59002D Unspecified physeal fracture of lower end of ulna, left arm, subsequent encounter for fracture with routine healing: Secondary | ICD-10-CM | POA: Diagnosis not present

## 2016-03-02 DIAGNOSIS — E441 Mild protein-calorie malnutrition: Secondary | ICD-10-CM | POA: Diagnosis not present

## 2016-03-07 DIAGNOSIS — E441 Mild protein-calorie malnutrition: Secondary | ICD-10-CM | POA: Diagnosis not present

## 2016-03-07 DIAGNOSIS — R296 Repeated falls: Secondary | ICD-10-CM | POA: Diagnosis not present

## 2016-03-07 DIAGNOSIS — M6281 Muscle weakness (generalized): Secondary | ICD-10-CM | POA: Diagnosis not present

## 2016-03-07 DIAGNOSIS — W19XXXD Unspecified fall, subsequent encounter: Secondary | ICD-10-CM | POA: Diagnosis not present

## 2016-03-07 DIAGNOSIS — J984 Other disorders of lung: Secondary | ICD-10-CM | POA: Diagnosis not present

## 2016-03-07 DIAGNOSIS — S59002D Unspecified physeal fracture of lower end of ulna, left arm, subsequent encounter for fracture with routine healing: Secondary | ICD-10-CM | POA: Diagnosis not present

## 2016-03-09 DIAGNOSIS — R296 Repeated falls: Secondary | ICD-10-CM | POA: Diagnosis not present

## 2016-03-09 DIAGNOSIS — S59002D Unspecified physeal fracture of lower end of ulna, left arm, subsequent encounter for fracture with routine healing: Secondary | ICD-10-CM | POA: Diagnosis not present

## 2016-03-09 DIAGNOSIS — W19XXXD Unspecified fall, subsequent encounter: Secondary | ICD-10-CM | POA: Diagnosis not present

## 2016-03-09 DIAGNOSIS — E441 Mild protein-calorie malnutrition: Secondary | ICD-10-CM | POA: Diagnosis not present

## 2016-03-09 DIAGNOSIS — J984 Other disorders of lung: Secondary | ICD-10-CM | POA: Diagnosis not present

## 2016-03-09 DIAGNOSIS — M6281 Muscle weakness (generalized): Secondary | ICD-10-CM | POA: Diagnosis not present

## 2016-04-27 ENCOUNTER — Telehealth: Payer: Self-pay

## 2016-04-27 NOTE — Telephone Encounter (Signed)
Pam says there has been a large decline in the last few months. Her other caretaker has noticed the decline. She has been a caretaker for many years and aware of the need for Hospice. It is becoming more difficult for Pam and the caregiver to take care of her. Pam said she went on the Hospice website and answered the questions: Is she thiriving-No, Losing Weight-yes Not eating. The pt is unable to walk quick enough to get to the restroom. She is having to stay by herself at night.

## 2016-04-27 NOTE — Telephone Encounter (Signed)
Left detailed message asking Maria Walker to call the office in the morning if she thought she could get her here tomorrow.

## 2016-04-27 NOTE — Telephone Encounter (Signed)
I still am not sure--as Medicare is very strict now with the criteria. Can they bring her in? I have to sign to certify that she is eligible and I can't be sure without assessing her

## 2016-04-27 NOTE — Telephone Encounter (Signed)
Please let her know that as of my last visit, she wouldn't be eligible for hospice yet. Has there been a big decline? I will probably need to see her before I can make a referral (if it is appropriate)

## 2016-04-27 NOTE — Telephone Encounter (Signed)
Pam pts POA left v/m requesting referral for hospice for pt. Pam request cb when referral done. Pt last seen annual on 02/07/16.

## 2016-05-07 DIAGNOSIS — E039 Hypothyroidism, unspecified: Secondary | ICD-10-CM | POA: Diagnosis not present

## 2016-05-07 DIAGNOSIS — M6281 Muscle weakness (generalized): Secondary | ICD-10-CM | POA: Diagnosis not present

## 2016-05-07 DIAGNOSIS — F0390 Unspecified dementia without behavioral disturbance: Secondary | ICD-10-CM | POA: Diagnosis not present

## 2016-05-07 DIAGNOSIS — R634 Abnormal weight loss: Secondary | ICD-10-CM | POA: Diagnosis not present

## 2016-05-10 NOTE — Telephone Encounter (Signed)
Left message to call back to follow up.

## 2016-07-07 DIAGNOSIS — R634 Abnormal weight loss: Secondary | ICD-10-CM | POA: Diagnosis not present

## 2016-07-07 DIAGNOSIS — F0390 Unspecified dementia without behavioral disturbance: Secondary | ICD-10-CM | POA: Diagnosis not present

## 2016-07-07 DIAGNOSIS — M6281 Muscle weakness (generalized): Secondary | ICD-10-CM | POA: Diagnosis not present

## 2016-07-07 DIAGNOSIS — E039 Hypothyroidism, unspecified: Secondary | ICD-10-CM | POA: Diagnosis not present

## 2016-10-03 ENCOUNTER — Other Ambulatory Visit: Payer: Self-pay | Admitting: Internal Medicine

## 2016-12-21 ENCOUNTER — Observation Stay
Admission: EM | Admit: 2016-12-21 | Discharge: 2016-12-23 | Disposition: A | Discharge status: Home / Self Care | Payer: Medicare Other | Attending: Internal Medicine | Admitting: Internal Medicine

## 2016-12-21 ENCOUNTER — Observation Stay: Payer: Medicare Other

## 2016-12-21 ENCOUNTER — Emergency Department: Payer: Medicare Other

## 2016-12-21 ENCOUNTER — Encounter: Payer: Self-pay | Admitting: Emergency Medicine

## 2016-12-21 DIAGNOSIS — R748 Abnormal levels of other serum enzymes: Secondary | ICD-10-CM | POA: Insufficient documentation

## 2016-12-21 DIAGNOSIS — I639 Cerebral infarction, unspecified: Secondary | ICD-10-CM | POA: Diagnosis present

## 2016-12-21 DIAGNOSIS — R7989 Other specified abnormal findings of blood chemistry: Secondary | ICD-10-CM | POA: Diagnosis not present

## 2016-12-21 DIAGNOSIS — R6889 Other general symptoms and signs: Secondary | ICD-10-CM | POA: Diagnosis not present

## 2016-12-21 DIAGNOSIS — S299XXA Unspecified injury of thorax, initial encounter: Secondary | ICD-10-CM | POA: Diagnosis not present

## 2016-12-21 DIAGNOSIS — W19XXXA Unspecified fall, initial encounter: Secondary | ICD-10-CM | POA: Diagnosis not present

## 2016-12-21 DIAGNOSIS — E871 Hypo-osmolality and hyponatremia: Secondary | ICD-10-CM | POA: Insufficient documentation

## 2016-12-21 DIAGNOSIS — I10 Essential (primary) hypertension: Secondary | ICD-10-CM | POA: Diagnosis not present

## 2016-12-21 DIAGNOSIS — R0682 Tachypnea, not elsewhere classified: Secondary | ICD-10-CM | POA: Diagnosis not present

## 2016-12-21 DIAGNOSIS — Z66 Do not resuscitate: Secondary | ICD-10-CM | POA: Diagnosis not present

## 2016-12-21 DIAGNOSIS — E860 Dehydration: Secondary | ICD-10-CM | POA: Insufficient documentation

## 2016-12-21 DIAGNOSIS — F0390 Unspecified dementia without behavioral disturbance: Secondary | ICD-10-CM | POA: Diagnosis not present

## 2016-12-21 DIAGNOSIS — G459 Transient cerebral ischemic attack, unspecified: Secondary | ICD-10-CM | POA: Diagnosis not present

## 2016-12-21 DIAGNOSIS — R000 Tachycardia, unspecified: Secondary | ICD-10-CM | POA: Diagnosis not present

## 2016-12-21 DIAGNOSIS — T796XXA Traumatic ischemia of muscle, initial encounter: Secondary | ICD-10-CM | POA: Diagnosis not present

## 2016-12-21 DIAGNOSIS — I6523 Occlusion and stenosis of bilateral carotid arteries: Secondary | ICD-10-CM | POA: Diagnosis not present

## 2016-12-21 DIAGNOSIS — N179 Acute kidney failure, unspecified: Secondary | ICD-10-CM | POA: Insufficient documentation

## 2016-12-21 DIAGNOSIS — R778 Other specified abnormalities of plasma proteins: Secondary | ICD-10-CM

## 2016-12-21 DIAGNOSIS — S0990XA Unspecified injury of head, initial encounter: Secondary | ICD-10-CM | POA: Diagnosis not present

## 2016-12-21 DIAGNOSIS — J40 Bronchitis, not specified as acute or chronic: Principal | ICD-10-CM | POA: Insufficient documentation

## 2016-12-21 DIAGNOSIS — F4320 Adjustment disorder, unspecified: Secondary | ICD-10-CM

## 2016-12-21 DIAGNOSIS — E890 Postprocedural hypothyroidism: Secondary | ICD-10-CM | POA: Diagnosis not present

## 2016-12-21 DIAGNOSIS — E039 Hypothyroidism, unspecified: Secondary | ICD-10-CM | POA: Diagnosis not present

## 2016-12-21 DIAGNOSIS — Z515 Encounter for palliative care: Secondary | ICD-10-CM

## 2016-12-21 DIAGNOSIS — I700 Atherosclerosis of aorta: Secondary | ICD-10-CM | POA: Insufficient documentation

## 2016-12-21 LAB — URINALYSIS, COMPLETE (UACMP) WITH MICROSCOPIC
Bacteria, UA: NONE SEEN
Bilirubin Urine: NEGATIVE
GLUCOSE, UA: 50 mg/dL — AB
KETONES UR: 5 mg/dL — AB
LEUKOCYTES UA: NEGATIVE
NITRITE: NEGATIVE
PH: 6 (ref 5.0–8.0)
Protein, ur: 30 mg/dL — AB
Specific Gravity, Urine: 1.011 (ref 1.005–1.030)

## 2016-12-21 LAB — CBC WITH DIFFERENTIAL/PLATELET
Basophils Absolute: 0 10*3/uL (ref 0–0.1)
Basophils Relative: 0 %
EOS PCT: 0 %
Eosinophils Absolute: 0 10*3/uL (ref 0–0.7)
HEMATOCRIT: 33.4 % — AB (ref 35.0–47.0)
Hemoglobin: 11 g/dL — ABNORMAL LOW (ref 12.0–16.0)
LYMPHS ABS: 0.4 10*3/uL — AB (ref 1.0–3.6)
LYMPHS PCT: 6 %
MCH: 27.6 pg (ref 26.0–34.0)
MCHC: 32.9 g/dL (ref 32.0–36.0)
MCV: 84.1 fL (ref 80.0–100.0)
MONO ABS: 0.6 10*3/uL (ref 0.2–0.9)
Monocytes Relative: 10 %
Neutro Abs: 5.2 10*3/uL (ref 1.4–6.5)
Neutrophils Relative %: 84 %
Platelets: 166 10*3/uL (ref 150–440)
RBC: 3.97 MIL/uL (ref 3.80–5.20)
RDW: 14.1 % (ref 11.5–14.5)
WBC: 6.2 10*3/uL (ref 3.6–11.0)

## 2016-12-21 LAB — COMPREHENSIVE METABOLIC PANEL
ALK PHOS: 72 U/L (ref 38–126)
ALT: 34 U/L (ref 14–54)
AST: 73 U/L — AB (ref 15–41)
Albumin: 3.5 g/dL (ref 3.5–5.0)
Anion gap: 10 (ref 5–15)
BILIRUBIN TOTAL: 1.3 mg/dL — AB (ref 0.3–1.2)
BUN: 16 mg/dL (ref 6–20)
CALCIUM: 8.1 mg/dL — AB (ref 8.9–10.3)
CO2: 24 mmol/L (ref 22–32)
CREATININE: 1.01 mg/dL — AB (ref 0.44–1.00)
Chloride: 95 mmol/L — ABNORMAL LOW (ref 101–111)
GFR calc Af Amer: 56 mL/min — ABNORMAL LOW (ref >60)
GFR, EST NON AFRICAN AMERICAN: 48 mL/min — AB (ref >60)
Glucose, Bld: 138 mg/dL — ABNORMAL HIGH (ref 65–99)
POTASSIUM: 3.8 mmol/L (ref 3.5–5.1)
Sodium: 129 mmol/L — ABNORMAL LOW (ref 135–145)
TOTAL PROTEIN: 7.6 g/dL (ref 6.5–8.1)

## 2016-12-21 LAB — TROPONIN I
TROPONIN I: 0.06 ng/mL — AB (ref <0.03)
TROPONIN I: 0.08 ng/mL — AB (ref <0.03)
TROPONIN I: 0.13 ng/mL — AB (ref <0.03)
Troponin I: 0.04 ng/mL (ref <0.03)
Troponin I: 0.14 ng/mL (ref <0.03)

## 2016-12-21 LAB — LACTIC ACID, PLASMA
Lactic Acid, Venous: 2.3 mmol/L (ref 0.5–1.9)
Lactic Acid, Venous: 1.3 mmol/L (ref 0.5–1.9)

## 2016-12-21 LAB — TSH: TSH: 0.167 u[IU]/mL — ABNORMAL LOW (ref 0.350–4.500)

## 2016-12-21 LAB — CK: Total CK: 427 U/L — ABNORMAL HIGH (ref 38–234)

## 2016-12-21 LAB — BRAIN NATRIURETIC PEPTIDE: B NATRIURETIC PEPTIDE 5: 343 pg/mL — AB (ref 0.0–100.0)

## 2016-12-21 MED ORDER — ACETAMINOPHEN 325 MG PO TABS
650.0000 mg | ORAL_TABLET | Freq: Four times a day (QID) | ORAL | Status: DC | PRN
  Administered 2016-12-22 – 2016-12-23 (×2): 650 mg via ORAL
  Filled 2016-12-21 (×2): qty 2

## 2016-12-21 MED ORDER — SODIUM CHLORIDE 0.9 % IV SOLN
Freq: Once | INTRAVENOUS | Status: AC
  Administered 2016-12-21: 10:00:00 via INTRAVENOUS

## 2016-12-21 MED ORDER — ENOXAPARIN SODIUM 30 MG/0.3ML ~~LOC~~ SOLN
30.0000 mg | SUBCUTANEOUS | Status: DC
  Administered 2016-12-21: 21:00:00 30 mg via SUBCUTANEOUS
  Filled 2016-12-21 (×2): qty 0.3

## 2016-12-21 MED ORDER — ACETAMINOPHEN 650 MG RE SUPP: 650.0000 mg | Freq: Four times a day (QID) | RECTAL | Status: DC | PRN

## 2016-12-21 MED ORDER — ASPIRIN EC 81 MG PO TBEC
81.0000 mg | DELAYED_RELEASE_TABLET | Freq: Every day | ORAL | Status: DC
  Administered 2016-12-21 – 2016-12-23 (×3): 81 mg via ORAL
  Filled 2016-12-21 (×3): qty 1

## 2016-12-21 MED ORDER — ONDANSETRON HCL 4 MG/2ML IJ SOLN: 4.0000 mg | Freq: Four times a day (QID) | INTRAMUSCULAR | Status: DC | PRN

## 2016-12-21 MED ORDER — ONDANSETRON HCL 4 MG PO TABS
4.0000 mg | ORAL_TABLET | Freq: Four times a day (QID) | ORAL | Status: DC | PRN
Start: 2016-12-21 — End: 2016-12-23

## 2016-12-21 MED ORDER — LEVOTHYROXINE SODIUM 100 MCG PO TABS
100.0000 ug | ORAL_TABLET | Freq: Every day | ORAL | Status: DC
  Administered 2016-12-22 – 2016-12-23 (×2): 100 ug via ORAL
  Filled 2016-12-21 (×2): qty 1

## 2016-12-21 MED ORDER — SODIUM CHLORIDE 0.9 % IV SOLN
INTRAVENOUS | Status: DC
  Administered 2016-12-21 – 2016-12-22 (×3): via INTRAVENOUS

## 2016-12-21 MED ORDER — SODIUM CHLORIDE 0.9% FLUSH: 3.0000 mL | Freq: Two times a day (BID) | INTRAVENOUS | Status: DC

## 2016-12-21 NOTE — H&P (Signed)
[facility] [place] [facility]   PATIENT NAME: Maria Walker    MR#:  [id number]  DATE OF BIRTH:  1926/12/27  DATE OF ADMISSION:  12/21/2016  PRIMARY CARE PHYSICIAN: Karie SchwalbeLetvak, Richard I, MD   REQUESTING/REFERRING PHYSICIAN: Dr. Dorothea GlassmanPaul Malinda  CHIEF COMPLAINT:   Chief Complaint  Patient presents with  . Fall    HISTORY OF PRESENT ILLNESS:  Maria Walker  is a 83 y.o. female with a known history of Chronic lung disease, hypothyroidism, previous history of hyponatremia who presents to the hospital after a fall and being found on the floor. Patient cannot recall any of the events and therefore most of the history obtained from the family at bedside. As per the family the left her yesterday evening around 7 pm or so and they have cameras and watch her from their own home. The daughter did not see her from the cameras in her bedroom or in the living room and therefore came to see her and found her down near her kitchen. Patient cannot recall the events and she denies any prodromal symptoms prior to her fall. She was brought to the emergency room for further evaluation. Patient was noted to have a mildly elevated troponin and as per the family she also had a slight left-sided facial droop. Patient denies any headache, nausea, vomiting, numbness or tingling or any other associated symptoms presently. Hospitalist services were contacted further treatment and evaluation.  PAST MEDICAL HISTORY:   Past Medical History:  Diagnosis Date  . Apical lung scarring   . Chronic lung disease    from past pneumonia  . Hyponatremia    with cessation of thyroid replacement  . Hypothyroidism    after goiter surgery    PAST SURGICAL HISTORY:   Past Surgical History:  Procedure Laterality Date  . APPENDECTOMY    . HIP SURGERY  2014   Left partial hip  . THYROIDECTOMY, PARTIAL  1948   goiter    SOCIAL HISTORY:   Social History  Substance Use Topics  . Smoking status: Never Smoker  .  Smokeless tobacco: Never Used  . Alcohol use No    FAMILY HISTORY:   Family History  Problem Relation Age of Onset  . Hypertension Unknown             DRUG ALLERGIES:   Allergies  Allergen Reactions  . Codeine Other (See Comments)    Reaction:  Hallucinations   . Prevnar [Pneumococcal 13-Val Conj Vacc]     Local reaction left arm 9/17     REVIEW OF SYSTEMS:   Review of Systems  Constitutional: Negative for fever and weight loss.  HENT: Negative for congestion, nosebleeds and tinnitus.   Eyes: Negative for blurred vision, double vision and redness.  Respiratory: Negative for cough, hemoptysis and shortness of breath.   Cardiovascular: Negative for chest pain, orthopnea, leg swelling and PND.  Gastrointestinal: Negative for abdominal pain, diarrhea, melena, nausea and vomiting.  Genitourinary: Negative for dysuria, hematuria and urgency.  Musculoskeletal: Positive for falls. Negative for joint pain.  Neurological: Negative for dizziness, tingling, sensory change, focal weakness, seizures, weakness and headaches.  Endo/Heme/Allergies: Negative for polydipsia. Does not bruise/bleed easily.  Psychiatric/Behavioral: Negative for depression and memory loss. The patient is not nervous/anxious.     MEDICATIONS AT HOME:   Prior to Admission medications   Medication Sig Start Date End Date Taking? Authorizing Provider  levothyroxine (SYNTHROID, LEVOTHROID) 100 MCG tablet TAKE ONE (1) TABLET BY MOUTH EVERY DAY  BEFORE BREAKFAST 10/03/16  Yes Karie SchwalbeLetvak, Richard I, MD  Multiple Vitamins-Minerals (CENTRUM SILVER PO) Take 1 tablet by mouth daily.   Yes [provider]  alendronate (FOSAMAX) 70 MG tablet Take 1 tablet (70 mg total) by mouth every 7 (seven) days. Take with a full glass of water on an empty stomach. Patient not taking: Reported on 12/21/2016 02/07/16   Karie SchwalbeLetvak, Richard I, MD      VITAL SIGNS:  Blood pressure (!) 176/92, pulse (!) 124, temperature 99 F (37.2 C),  temperature source Oral, resp. rate 20, height 4\' 11"  (1.499 m), weight 42.6 kg (94 lb), SpO2 93 %.  PHYSICAL EXAMINATION:  Physical Exam  GENERAL:  83 y.o.-year-old patient lying in the bed in no acute distress.  EYES: Pupils equal, round, reactive to light and accommodation. No scleral icterus. Extraocular muscles intact.  HEENT: Head atraumatic, normocephalic. Oropharynx and nasopharynx clear. No oropharyngeal erythema, dry oral mucosa  NECK:  Supple, no jugular venous distention. No thyroid enlargement, no tenderness.  LUNGS: Normal breath sounds bilaterally, no wheezing, rales, rhonchi. No use of accessory muscles of respiration.  CARDIOVASCULAR: S1, S2 RRR, tachycardic. No murmurs, rubs, gallops, clicks.  ABDOMEN: Soft, nontender, nondistended. Bowel sounds present. No organomegaly or mass.  EXTREMITIES: No pedal edema, cyanosis, or clubbing. + 2 pedal & radial pulses b/l.   NEUROLOGIC: Cranial nerves II through XII are intact. No focal Motor or sensory deficits appreciated b/l. Globally weak. PSYCHIATRIC: The patient is alert and oriented x 3.  SKIN: No obvious rash, lesion, or ulcer.   LABORATORY PANEL:   CBC  Recent Labs Lab 12/21/16 0821  WBC 6.2  HGB 11.0*  HCT 33.4*  PLT 166   ------------------------------------------------------------------------------------------------------------------  Chemistries   Recent Labs Lab 12/21/16 0821  NA 129*  K 3.8  CL 95*  CO2 24  GLUCOSE 138*  BUN 16  CREATININE 1.01*  CALCIUM 8.1*  AST 73*  ALT 34  ALKPHOS 72  BILITOT 1.3*   ------------------------------------------------------------------------------------------------------------------  Cardiac Enzymes  Recent Labs Lab 12/21/16 0821  TROPONINI 0.04*   ------------------------------------------------------------------------------------------------------------------  RADIOLOGY:  Ct Head Wo Contrast  Result Date: 12/21/2016 CLINICAL DATA:  Un witnessed  fall.  TIA, initial exam. EXAM: CT HEAD WITHOUT CONTRAST TECHNIQUE: Contiguous axial images were obtained from the base of the skull through the vertex without intravenous contrast. COMPARISON:  Head CT 07/02/2015 FINDINGS: Brain: Scattered areas of hypodensity in the white matter. Findings are similar to the previous examination and suggestive for chronic changes. No evidence for acute hemorrhage, mass lesion, midline shift, hydrocephalus or large infarct. Vascular: No hyperdense vessel or unexpected calcification. Skull: Normal. Negative for fracture or focal lesion. Sinuses/Orbits: Small amount of mucosal disease and in the posterior ethmoid air cells bilaterally. Small amount of mucosal disease in left sphenoid sinus. Sinus disease distribution is similar to the previous examination. Other: None. IMPRESSION: No acute intracranial abnormality. Scattered hypodensities in the white matter are similar to the previous examination. Findings probably represent chronic small vessel ischemic disease. Chronic sinus disease. Electronically Signed   By: Richarda OverlieAdam  Henn M.D.   On: 12/21/2016 08:49   Dg Chest Portable 1 View  Result Date: 12/21/2016 CLINICAL DATA:  Unwitnessed fall with patient being found on the floor in the living room. EXAM: PORTABLE CHEST 1 VIEW COMPARISON:  Chest x-ray of July 02, 2015 FINDINGS: The lungs are adequately inflated. The interstitial markings are coarse and slightly more conspicuous overall than on the previous study. There is no alveolar infiltrate. There may  be a trace of pleural fluid at the right lung base. The heart is normal in size. The pulmonary vascularity is not engorged. There is calcification in the wall of the thoracic aorta. The observed bony thorax exhibits no acute abnormality. IMPRESSION: Chronic interstitial changes in both lungs with slight overall increase which may reflect mild interstitial edema of cardiac or noncardiac cause. There is stable scarring at the lung  bases greatest on the left. Possible tiny right pleural effusion. Thoracic aortic atherosclerosis. Electronically Signed   By: David  [place] M.D.   On: 12/21/2016 08:46     IMPRESSION AND PLAN:   83 year old female with past medical history of hypothyroidism, chronic lung disease who presented to the hospital after a fall and being found on the floor.  1. TIA/CVA-suspected diagnoses given patient's slurred speech after her fall and also some left-sided facial drooping. -CT head is negative for acute pathology. I will get MRI of the brain, carotid duplex, echocardiogram. -Start patient on aspirin, get a physical therapy consult. If patient is positive for CVA would consider getting a neurology consult.  2. Status post fall-we'll get physical therapy to evaluate patient.  3. Elevated troponin-likely supply demand ischemia from tachycardia. -We'll observe on telemetry, cycle her cardiac markers, check two-dimensional echocardiogram.  4. Hypothyroidism-continue Synthroid.  5. Hyponatremia-mild and patient is asymptomatic. We'll gently hydrate with IV fluids, follow sodium.  6. Acute kidney injury-secondary to dehydration, we'll hydrate with IV fluids, follow BUN and creatinine urine output.    All the records are reviewed and case discussed with ED provider. Management plans discussed with the patient, family and they are in agreement.  CODE STATUS: DO NOT RESUSCITATE  TOTAL TIME TAKING CARE OF THIS PATIENT: 45 minutes.    Houston Siren M.D on 12/21/2016 at 11:12 AM  Between 7am to 6pm - Pager - [phone]  After 6pm go to [email or web address] - password [email or web address] [facility]  [facility]  Office  [phone]  CC: Primary care physician; Karie Schwalbe, MD

## 2016-12-21 NOTE — ED Notes (Signed)
Patient transported to CT 

## 2016-12-21 NOTE — Progress Notes (Signed)
Anticoagulation monitoring(Lovenox):  83yo  female ordered Lovenox 40 mg Q24h  Filed Weights   12/21/16 0814  Weight: 94 lb (42.6 kg)   BMI 18.9   Lab Results  Component Value Date   CREATININE 1.01 (H) 12/21/2016   CREATININE 0.5 01/05/2016   CREATININE 0.5 12/21/2015   Estimated Creatinine Clearance: 25.9 mL/min (A) (by C-G formula based on SCr of 1.01 mg/dL (H)). Hemoglobin & Hematocrit     Component Value Date/Time   HGB 11.0 (L) 12/21/2016 0821   HCT 33.4 (L) 12/21/2016 16100821     Per Protocol for Patient with estCrcl < 30 ml/min and BMI < 40, will transition to Lovenox 30 mg Q24h.

## 2016-12-21 NOTE — ED Provider Notes (Addendum)
[facility] Emergency Department Provider Note   ____________________________________________   First MD Initiated Contact with Patient 12/21/16 [phone]     (approximate)  I have reviewed the triage vital signs and the nursing notes.   HISTORY  Chief Complaint Fall    HPI ERAN WINDISH is a 83 y.o. female history obtained mostly from EMS. Patient fell sometime last night spent an undetermined amount of time on the floor. Patient was leaning to the left and lying on her left side when EMS found her patient continued leaning to the left when EMS picked her up. On arrival in the emergency room patient says she did not know how long she was on the floor. She says nothing hurts her and she just doesn't feel well she cannot pinpoint things any closer than that she has no headache neck pain backache chest pain belly pain hip pain and leg pain fever chills cough dysuria or any other complaints but does not feel well. She is tachycardic at a rate of 124. Past Medical History:  Diagnosis Date  . Apical lung scarring   . Chronic lung disease    from past pneumonia  . Hyponatremia    with cessation of thyroid replacement  . Hypothyroidism    after goiter surgery    Patient Active Problem List   Diagnosis Date Noted  . Immunization reaction 02/16/2016  . Preventative health care 02/07/2016  . Fracture of left wrist with routine healing 02/07/2016  . Osteoporosis 02/07/2016  . Advance directive discussed with patient 02/07/2016  . Mild malnutrition (HCC) 07/28/2015  . Hypothyroidism   . Chronic lung disease   . Abnormal TSH 07/03/2015  . Hyponatremia 07/02/2015    Past Surgical History:  Procedure Laterality Date  . APPENDECTOMY    . HIP SURGERY  2014   Left partial hip  . THYROIDECTOMY, PARTIAL  1948   goiter    Prior to Admission medications   Medication Sig Start Date End Date Taking? Authorizing Provider  levothyroxine (SYNTHROID, LEVOTHROID) 100  MCG tablet TAKE ONE (1) TABLET BY MOUTH EVERY DAY BEFORE BREAKFAST 10/03/16  Yes Karie Schwalbe, MD  Multiple Vitamins-Minerals (CENTRUM SILVER PO) Take 1 tablet by mouth daily.   Yes [provider]  alendronate (FOSAMAX) 70 MG tablet Take 1 tablet (70 mg total) by mouth every 7 (seven) days. Take with a full glass of water on an empty stomach. Patient not taking: Reported on 12/21/2016 02/07/16   Karie Schwalbe, MD    Allergies Codeine and Prevnar [pneumococcal 13-val conj vacc]  Family History  Problem Relation Age of Onset  . Hypertension Unknown             Social History Social History  Substance Use Topics  . Smoking status: Never Smoker  . Smokeless tobacco: Never Used  . Alcohol use No    Review of Systems  Constitutional: No fever/chills Eyes: No visual changes. ENT: No sore throat. Cardiovascular: Denies chest pain. Respiratory: Denies shortness of breath. Gastrointestinal: No abdominal pain.  No nausea, no vomiting.  No diarrhea.  No constipation. Genitourinary: Negative for dysuria. Musculoskeletal: Negative for back pain. Skin: Negative for rash. Neurological: Negative for headaches, focal weakness or numbness.   ____________________________________________   PHYSICAL EXAM:  VITAL SIGNS: ED Triage Vitals  Enc Vitals Group     BP 12/21/16 0819 (!) 176/92     Pulse Rate 12/21/16 0819 (!) 124     Resp 12/21/16 0819 20  Temp 12/21/16 0819 99 F (37.2 C)     Temp Source 12/21/16 0819 Oral     SpO2 12/21/16 0819 93 %     Weight 12/21/16 0814 94 lb (42.6 kg)     Height 12/21/16 0814 4\' 11"  (1.499 m)     Head Circumference --      Peak Flow --      Pain Score --      Pain Loc --      Pain Edu? --      Excl. in GC? --     Constitutional: Alert and oriented. Well appearing and in no acute distress. Eyes: Conjunctivae are normal.  Head: Atraumatic. Nose: No congestion/rhinnorhea. Mouth/Throat: Mucous membranes are moist.  Oropharynx  non-erythematous. Neck: No stridor.  No cervical spine tenderness to palpation. Cardiovascular: Normal rate, regular rhythm. Grossly normal heart sounds.  Good peripheral circulation. Respiratory: Normal respiratory effort.  No retractions. Lungs CTAB. Gastrointestinal: Soft and nontender. No distention. No abdominal bruits. No CVA tenderness. Musculoskeletal: No lower extremity tenderness trace edema in the ankles.  No joint effusions. Neurologic:  Normal speech and language. No gross focal neurologic deficits are appreciated.  Skin:  Skin is warm, dry and intact. No rash noted. Psychiatric: Mood and affect are normal. Speech and behavior are normal.  ____________________________________________   LABS (all labs ordered are listed, but only abnormal results are displayed)  Labs Reviewed  COMPREHENSIVE METABOLIC PANEL - Abnormal; Notable for the following:       Result Value   Sodium 129 (*)    Chloride 95 (*)    Glucose, Bld 138 (*)    Creatinine, Ser 1.01 (*)    Calcium 8.1 (*)    AST 73 (*)    Total Bilirubin 1.3 (*)    GFR calc non Af Amer 48 (*)    GFR calc Af Amer 56 (*)    All other components within normal limits  BRAIN NATRIURETIC PEPTIDE - Abnormal; Notable for the following:    B Natriuretic Peptide 343.0 (*)    All other components within normal limits  TROPONIN I - Abnormal; Notable for the following:    Troponin I 0.04 (*)    All other components within normal limits  LACTIC ACID, PLASMA - Abnormal; Notable for the following:    Lactic Acid, Venous 2.3 (*)    All other components within normal limits  CBC WITH DIFFERENTIAL/PLATELET - Abnormal; Notable for the following:    Hemoglobin 11.0 (*)    HCT 33.4 (*)    Lymphs Abs 0.4 (*)    All other components within normal limits  URINALYSIS, COMPLETE (UACMP) WITH MICROSCOPIC - Abnormal; Notable for the following:    Color, Urine YELLOW (*)    APPearance CLEAR (*)    Glucose, UA 50 (*)    Hgb urine dipstick  MODERATE (*)    Ketones, ur 5 (*)    Protein, ur 30 (*)    Squamous Epithelial / LPF 0-5 (*)    All other components within normal limits  CK - Abnormal; Notable for the following:    Total CK 427 (*)    All other components within normal limits  TSH - Abnormal; Notable for the following:    TSH 0.167 (*)    All other components within normal limits  LACTIC ACID, PLASMA   ____________________________________________  EKG  EKG read and interpreted by me shows sinus tachycardia rate of 124 rightward axis deviation no apparent acute ST-T wave changes  baseline is very poor ____________________________________________  RADIOLOGY  Ct Head Wo Contrast  Result Date: 12/21/2016 CLINICAL DATA:  Un witnessed fall.  TIA, initial exam. EXAM: CT HEAD WITHOUT CONTRAST TECHNIQUE: Contiguous axial images were obtained from the base of the skull through the vertex without intravenous contrast. COMPARISON:  Head CT 07/02/2015 FINDINGS: Brain: Scattered areas of hypodensity in the white matter. Findings are similar to the previous examination and suggestive for chronic changes. No evidence for acute hemorrhage, mass lesion, midline shift, hydrocephalus or large infarct. Vascular: No hyperdense vessel or unexpected calcification. Skull: Normal. Negative for fracture or focal lesion. Sinuses/Orbits: Small amount of mucosal disease and in the posterior ethmoid air cells bilaterally. Small amount of mucosal disease in left sphenoid sinus. Sinus disease distribution is similar to the previous examination. Other: None. IMPRESSION: No acute intracranial abnormality. Scattered hypodensities in the white matter are similar to the previous examination. Findings probably represent chronic small vessel ischemic disease. Chronic sinus disease. Electronically Signed   By: Richarda OverlieAdam  Henn M.D.   On: 12/21/2016 08:49   Dg Chest Portable 1 View  Result Date: 12/21/2016 CLINICAL DATA:  Unwitnessed fall with patient being found on  the floor in the living room. EXAM: PORTABLE CHEST 1 VIEW COMPARISON:  Chest x-ray of July 02, 2015 FINDINGS: The lungs are adequately inflated. The interstitial markings are coarse and slightly more conspicuous overall than on the previous study. There is no alveolar infiltrate. There may be a trace of pleural fluid at the right lung base. The heart is normal in size. The pulmonary vascularity is not engorged. There is calcification in the wall of the thoracic aorta. The observed bony thorax exhibits no acute abnormality. IMPRESSION: Chronic interstitial changes in both lungs with slight overall increase which may reflect mild interstitial edema of cardiac or noncardiac cause. There is stable scarring at the lung bases greatest on the left. Possible tiny right pleural effusion. Thoracic aortic atherosclerosis. Electronically Signed   By: David  [place] M.D.   On: 12/21/2016 08:46    ____________________________________________   PROCEDURES  Procedure(s) performed:  Procedures  Critical Care performed:   ____________________________________________   INITIAL IMPRESSION / ASSESSMENT AND PLAN / ED COURSE  Pertinent labs & imaging results that were available during my care of the patient were reviewed by me and considered in my medical decision making (see chart for details).        ____________________________________________   FINAL CLINICAL IMPRESSION(S) / ED DIAGNOSES  Final diagnoses:  Fall, initial encounter  Elevated troponin I level  Tachycardia      NEW MEDICATIONS STARTED DURING THIS VISIT:  New Prescriptions   No medications on file     Note:  This document was prepared using Dragon voice recognition software and may include unintentional dictation errors.    Arnaldo NatalMalinda, Waldo Damian F, MD 12/21/16 [phone] Families now here reports she seems to have little bit of a left facial droop and was leaning to the left too.   Arnaldo NatalMalinda, Shaday Rayborn F, MD 12/21/16 [phone]

## 2016-12-21 NOTE — Progress Notes (Signed)
troponins trending up - echo & ASA are already ordered for stroke w/up. Will order 2 more sets of troponins and request cardio c/s in am

## 2016-12-21 NOTE — Progress Notes (Signed)
Primary Nurse noted on labs that pts troponin's were 0.13. Primary nurse also noted upon assessment that pt has increased confusion than prior shift documention. Pt currently is A&O to person only. Primary Nurse notified Dr. Sherryll BurgerShah. Dr. Sherryll BurgerShah to place orders for cardiology consult. Primary nurse to continue to monitor.

## 2016-12-21 NOTE — ED Notes (Signed)
Date and time results received: 12/21/16 9:28 AM  (use smartphrase ".now" to insert current time)  Test: troponin Critical Value: 0.04  Name of Provider Notified: Dr. Darnelle CatalanMalinda  Orders Received? Or Actions Taken?: Orders Received - See Orders for details

## 2016-12-21 NOTE — ED Triage Notes (Signed)
Pt arrived via EMS from home for reports of unwitnessed fall. Pt daughter found her on floor in living room. Pt family reports yesterday pt had decreased appetite, wasn't feeling herself and had dark urine. EMS reports 103 HR, 152/84.

## 2016-12-21 NOTE — ED Notes (Signed)
Date and time results received: 12/21/16 9:06 AM  (use smartphrase ".now" to insert current time)  Test: lactic acid Critical Value: 2.3  Name of Provider Notified: Dr. Darnelle CatalanMalinda  Orders Received? Or Actions Taken?: Orders Received - See Orders for details

## 2016-12-22 ENCOUNTER — Observation Stay (HOSPITAL_BASED_OUTPATIENT_CLINIC_OR_DEPARTMENT_OTHER)
Admit: 2016-12-22 | Discharge: 2016-12-22 | Disposition: A | Discharge status: Home / Self Care | Payer: Medicare Other | Attending: Specialist | Admitting: Specialist

## 2016-12-22 ENCOUNTER — Telehealth: Payer: Self-pay

## 2016-12-22 ENCOUNTER — Encounter: Payer: Self-pay | Admitting: Nurse Practitioner

## 2016-12-22 ENCOUNTER — Observation Stay: Admit: 2016-12-22 | Payer: Medicare Other

## 2016-12-22 DIAGNOSIS — F4320 Adjustment disorder, unspecified: Secondary | ICD-10-CM | POA: Diagnosis not present

## 2016-12-22 DIAGNOSIS — R000 Tachycardia, unspecified: Secondary | ICD-10-CM | POA: Diagnosis not present

## 2016-12-22 DIAGNOSIS — I639 Cerebral infarction, unspecified: Secondary | ICD-10-CM

## 2016-12-22 DIAGNOSIS — W19XXXD Unspecified fall, subsequent encounter: Secondary | ICD-10-CM

## 2016-12-22 DIAGNOSIS — Z515 Encounter for palliative care: Secondary | ICD-10-CM | POA: Diagnosis not present

## 2016-12-22 DIAGNOSIS — R748 Abnormal levels of other serum enzymes: Secondary | ICD-10-CM

## 2016-12-22 DIAGNOSIS — Z7189 Other specified counseling: Secondary | ICD-10-CM

## 2016-12-22 DIAGNOSIS — R4182 Altered mental status, unspecified: Secondary | ICD-10-CM

## 2016-12-22 DIAGNOSIS — I10 Essential (primary) hypertension: Secondary | ICD-10-CM | POA: Diagnosis not present

## 2016-12-22 DIAGNOSIS — J209 Acute bronchitis, unspecified: Secondary | ICD-10-CM | POA: Diagnosis not present

## 2016-12-22 DIAGNOSIS — R296 Repeated falls: Secondary | ICD-10-CM | POA: Diagnosis not present

## 2016-12-22 DIAGNOSIS — M6282 Rhabdomyolysis: Secondary | ICD-10-CM | POA: Diagnosis not present

## 2016-12-22 DIAGNOSIS — W19XXXA Unspecified fall, initial encounter: Secondary | ICD-10-CM

## 2016-12-22 DIAGNOSIS — I361 Nonrheumatic tricuspid (valve) insufficiency: Secondary | ICD-10-CM | POA: Diagnosis not present

## 2016-12-22 DIAGNOSIS — J40 Bronchitis, not specified as acute or chronic: Secondary | ICD-10-CM | POA: Diagnosis not present

## 2016-12-22 LAB — CBC
HEMATOCRIT: 31.2 % — AB (ref 35.0–47.0)
HEMOGLOBIN: 10.5 g/dL — AB (ref 12.0–16.0)
MCH: 28.1 pg (ref 26.0–34.0)
MCHC: 33.7 g/dL (ref 32.0–36.0)
MCV: 83.4 fL (ref 80.0–100.0)
Platelets: 167 10*3/uL (ref 150–440)
RBC: 3.74 MIL/uL — ABNORMAL LOW (ref 3.80–5.20)
RDW: 14.2 % (ref 11.5–14.5)
WBC: 4.6 10*3/uL (ref 3.6–11.0)

## 2016-12-22 LAB — BASIC METABOLIC PANEL
Anion gap: 11 (ref 5–15)
BUN: 14 mg/dL (ref 6–20)
CHLORIDE: 98 mmol/L — AB (ref 101–111)
CO2: 21 mmol/L — ABNORMAL LOW (ref 22–32)
Calcium: 7.8 mg/dL — ABNORMAL LOW (ref 8.9–10.3)
Creatinine, Ser: 0.83 mg/dL (ref 0.44–1.00)
GFR calc Af Amer: >60 mL/min (ref >60)
GFR calc non Af Amer: >60 mL/min (ref >60)
GLUCOSE: 98 mg/dL (ref 65–99)
POTASSIUM: 3.2 mmol/L — AB (ref 3.5–5.1)
Sodium: 130 mmol/L — ABNORMAL LOW (ref 135–145)

## 2016-12-22 LAB — TROPONIN I
Troponin I: 0.19 ng/mL (ref <0.03)
Troponin I: 0.25 ng/mL (ref <0.03)

## 2016-12-22 MED ORDER — DEXTROSE 5 % IV SOLN
1.0000 g | INTRAVENOUS | Status: DC
  Administered 2016-12-22 – 2016-12-23 (×2): 1 g via INTRAVENOUS
  Filled 2016-12-22 (×3): qty 10

## 2016-12-22 MED ORDER — HYDRALAZINE HCL 50 MG PO TABS
25.0000 mg | ORAL_TABLET | Freq: Four times a day (QID) | ORAL | Status: DC
  Administered 2016-12-22 – 2016-12-23 (×5): 25 mg via ORAL
  Filled 2016-12-22 (×5): qty 1

## 2016-12-22 MED ORDER — POTASSIUM CHLORIDE CRYS ER 20 MEQ PO TBCR
40.0000 meq | EXTENDED_RELEASE_TABLET | Freq: Once | ORAL | Status: AC
  Administered 2016-12-22: 40 meq via ORAL
  Filled 2016-12-22: qty 2

## 2016-12-22 MED ORDER — METOPROLOL TARTRATE 25 MG PO TABS
25.0000 mg | ORAL_TABLET | Freq: Two times a day (BID) | ORAL | Status: DC
  Administered 2016-12-22 – 2016-12-23 (×3): 25 mg via ORAL
  Filled 2016-12-22 (×3): qty 1

## 2016-12-22 NOTE — Telephone Encounter (Signed)
Please give the order.  Thanks. Routed to PCP as FYI.

## 2016-12-22 NOTE — Consult Note (Signed)
Cardiology Consult    Patient ID: BRIANNIA LABA MRN: [id number], DOB/AGE: 1927/05/04   Admit date: 12/21/2016 Date of Consult: 12/22/2016  Primary Physician: Karie Schwalbe, MD Primary Cardiologist: new Requesting Provider: Ozella Rocks, MD  Patient Profile    Maria Walker is a 83 y.o. female with a history of hypothyroidism and prior falls, who is being seen today for the evaluation of ? Syncope and troponin elevation at the request of Dr. Elisabeth Pigeon.  Past Medical History   Past Medical History:  Diagnosis Date  . Apical lung scarring   . Chronic lung disease    from past pneumonia  . Hyponatremia    with cessation of thyroid replacement  . Hypothyroidism    after goiter surgery    Past Surgical History:  Procedure Laterality Date  . APPENDECTOMY    . HIP SURGERY  2014   Left partial hip  . THYROIDECTOMY, PARTIAL  1948   goiter     Allergies  Allergies  Allergen Reactions  . Codeine Other (See Comments)    Reaction:  Hallucinations   . Prevnar [Pneumococcal 13-Val Conj Vacc]     Local reaction left arm 9/17     History of Present Illness    83 y/o ? without prior cardiac history.  She does have a h/o hypothyroidism and chronic lung disease.  She lives locally by herself but has family nearby.  She says that she is still pretty active and still drives and does her own grocery shopping.  She denies any prior history of chest pain or DOE.  In Feb 2017, she was admitted to [facility] after being found down @ home with weakness and AMS.  At that time, she was noted to be profoundly hypothyroid (TSH 29.96).  CK was elevated @ 1608, presumably in the setting of prolonged time on the floor.  She doesn't remember that episode @ this time but as above, states that she's been doing well.  She was in her usoh until 8/2, when family noted that they could not see her on the cameras that they have installed in her home.  As a result, her dtr went to her home and found her down on the  kitchen floor.  Pt has no recollection of events that led to fall.  EMS was called and pt was taken into the [facility] ED.  Here, Head CT and MRI did not show any acute intracranial findings.  ECG was non-acute however troponin was elevated @ 0.04.  This has since risen to 0.25.  Pt denies any c/p or dyspnea since admission.  Total CK is 427.  Her biggest complaint at this time is that I am asking too many questions.  Inpatient Medications    . aspirin EC  81 mg Oral Daily  . enoxaparin (LOVENOX) injection  30 mg Subcutaneous Q24H  . levothyroxine  100 mcg Oral QAC breakfast  . metoprolol tartrate  25 mg Oral BID    Family History    Family History  Problem Relation Age of Onset  . Hypertension Unknown             Social History    Social History   Social History  . Marital status: Widowed    Spouse name: N/A  . Number of children: 3  . Years of education: N/A   Occupational History  . Office work     Retired   Social History Main Topics  . Smoking status: Never Smoker  .  Smokeless tobacco: Never Used  . Alcohol use No  . Drug use: No  . Sexual activity: Not on file   Other Topics Concern  . Not on file   Social History Narrative   Widowed 1989. Lives locally by herself. Family nearby.  Pt still drives and does her own grocery shopping.      No living will   Daughter Elita Quickam is her health care POA   Has DNR already   No tube feeds if cognitively unaware     Review of Systems    General:  No chills, fever, night sweats or weight changes.  Cardiovascular:  No chest pain, dyspnea on exertion, edema, orthopnea, palpitations, paroxysmal nocturnal dyspnea. Dermatological: No rash, lesions/masses Respiratory: No cough, dyspnea Urologic: No hematuria, dysuria Abdominal:   No nausea, vomiting, diarrhea, bright red blood per rectum, melena, or hematemesis Neurologic:  No visual changes, +++ wkns, no changes in mental status. All other systems reviewed and are otherwise  negative except as noted above.  Physical Exam    Blood pressure (!) 154/83, pulse (!) 105, temperature 99.7 F (37.6 C), temperature source Oral, resp. rate (!) 22, height 4\' 11"  (1.499 m), weight 94 lb (42.6 kg), SpO2 96 %.  General: Pleasant, NAD Psych: Flat affect. Neuro: Alert and oriented X 3. Moves all extremities spontaneously. HEENT: Normal  Neck: Supple without bruits or JVD. Lungs:  Resp regular and unlabored, diminished breath sounds bilat - poor effort. Heart: RRR no s3, s4, or murmurs. Abdomen: Soft, non-tender, non-distended, BS + x 4.  Extremities: No clubbing, cyanosis or edema. DP/PT/Radials 2+ and equal bilaterally. Skin is very dry and cracking.  Labs      Recent Labs  12/21/16 0821  12/21/16 2115 12/21/16 2259 12/22/16 0256 12/22/16 0647  CKTOTAL 427*  --   --   --   --   --   TROPONINI 0.04*  < > 0.13* 0.14* 0.19* 0.25*  < > = values in this interval not displayed. Lab Results  Component Value Date   WBC 4.6 12/22/2016   HGB 10.5 (L) 12/22/2016   HCT 31.2 (L) 12/22/2016   MCV 83.4 12/22/2016   PLT 167 12/22/2016    Recent Labs Lab 12/21/16 0821 12/22/16 0256  NA 129* 130*  K 3.8 3.2*  CL 95* 98*  CO2 24 21*  BUN 16 14  CREATININE 1.01* 0.83  CALCIUM 8.1* 7.8*  PROT 7.6  --   BILITOT 1.3*  --   ALKPHOS 72  --   ALT 34  --   AST 73*  --   GLUCOSE 138* 98   Lab Results  Component Value Date   TSH 0.167 (L) 12/21/2016      Radiology Studies    Ct Head Wo Contrast  Result Date: 12/21/2016 CLINICAL DATA:  Un witnessed fall.  TIA, initial exam. EXAM: CT HEAD WITHOUT CONTRAST TECHNIQUE: Contiguous axial images were obtained from the base of the skull through the vertex without intravenous contrast. COMPARISON:  Head CT 07/02/2015 FINDINGS: Brain: Scattered areas of hypodensity in the white matter. Findings are similar to the previous examination and suggestive for chronic changes. No evidence for acute hemorrhage, mass lesion, midline  shift, hydrocephalus or large infarct. Vascular: No hyperdense vessel or unexpected calcification. Skull: Normal. Negative for fracture or focal lesion. Sinuses/Orbits: Small amount of mucosal disease and in the posterior ethmoid air cells bilaterally. Small amount of mucosal disease in left sphenoid sinus. Sinus disease distribution is similar to the previous examination.  Other: None. IMPRESSION: No acute intracranial abnormality. Scattered hypodensities in the white matter are similar to the previous examination. Findings probably represent chronic small vessel ischemic disease. Chronic sinus disease. Electronically Signed   By: Richarda Overlie M.D.   On: 12/21/2016 08:49   Mr Brain Wo Contrast  Result Date: 12/21/2016 CLINICAL DATA:  83 y/o F; TIA, initial exam. Patient found on floor. EXAM: MRI HEAD WITHOUT CONTRAST TECHNIQUE: Multiplanar, multiecho pulse sequences of the brain and surrounding structures were obtained without intravenous contrast. COMPARISON:  12/21/2016 CT of the head. FINDINGS: Brain: No acute infarction, hemorrhage, hydrocephalus, extra-axial collection or mass lesion. Nonspecific foci of T2 FLAIR hyperintense signal abnormality in subcortical and periventricular white matter are compatible with moderate chronic microvascular ischemic changes and there is moderate brain parenchymal volume loss. Vascular: Normal flow voids. Skull and upper cervical spine: Normal marrow signal. Sinuses/Orbits: No abnormal signal of paranasal sinuses or mastoid air cells. Right intra-ocular lens replacement. Other: None. IMPRESSION: 1. No acute intracranial abnormality identified. 2. Moderate chronic microvascular ischemic changes and moderate parenchymal volume loss of the brain. Electronically Signed   By: Mitzi Hansen M.D.   On: 12/21/2016 17:30   US Carotid Bilateral  Result Date: 12/21/2016 CLINICAL DATA:  83 year old female with symptoms of cerebrovascular accident EXAM: BILATERAL CAROTID  DUPLEX ULTRASOUND TECHNIQUE: Wallace Cullens scale imaging, color Doppler and duplex ultrasound were performed of bilateral carotid and vertebral arteries in the neck. COMPARISON:  Head CT 12/21/2016 FINDINGS: Criteria: Quantification of carotid stenosis is based on velocity parameters that correlate the residual internal carotid diameter with NASCET-based stenosis levels, using the diameter of the distal internal carotid lumen as the denominator for stenosis measurement. The following velocity measurements were obtained: RIGHT ICA:  53/15 cm/sec CCA:  126/15 cm/sec SYSTOLIC ICA/CCA RATIO:  0.4 DIASTOLIC ICA/CCA RATIO:  0.9 ECA:  79 cm/sec LEFT ICA:  74/19 cm/sec CCA:  82/10 cm/sec SYSTOLIC ICA/CCA RATIO:  0.9 DIASTOLIC ICA/CCA RATIO:  1.8 ECA:  83 cm/sec RIGHT CAROTID ARTERY: Trace heterogeneous atherosclerotic plaque. By peak systolic velocity criteria the estimated stenosis remains less than 50%. RIGHT VERTEBRAL ARTERY:  Patent with normal antegrade flow. LEFT CAROTID ARTERY: Mild heterogeneous atherosclerotic plaque with calcification. By peak systolic velocity criteria the estimated stenosis remains less than 50%. LEFT VERTEBRAL ARTERY:  Patent with normal antegrade flow. IMPRESSION: 1. Mild (1-49%) stenosis proximal right internal carotid artery secondary to trace heterogeneous atherosclerotic plaque. 2. Mild (1-49%) stenosis proximal left internal carotid artery secondary to focal heterogeneous and partially calcified atherosclerotic plaque. 3. Vertebral arteries are patent with antegrade flow. Signed, Sterling Big, MD [facility] Radiology Electronically Signed   By: Malachy Moan M.D.   On: 12/21/2016 16:36   Dg Chest Portable 1 View  Result Date: 12/21/2016 CLINICAL DATA:  Unwitnessed fall with patient being found on the floor in the living room. EXAM: PORTABLE CHEST 1 VIEW COMPARISON:  Chest x-ray of July 02, 2015 FINDINGS: The lungs are adequately  inflated. The interstitial markings are coarse and slightly more conspicuous overall than on the previous study. There is no alveolar infiltrate. There may be a trace of pleural fluid at the right lung base. The heart is normal in size. The pulmonary vascularity is not engorged. There is calcification in the wall of the thoracic aorta. The observed bony thorax exhibits no acute abnormality. IMPRESSION: Chronic interstitial changes in both lungs with slight overall increase which may reflect mild interstitial edema of cardiac or noncardiac cause. There is  stable scarring at the lung bases greatest on the left. Possible tiny right pleural effusion. Thoracic aortic atherosclerosis. Electronically Signed   By: David  SwazilandJordan M.D.   On: 12/21/2016 08:46    ECG & Cardiac Imaging    Sinus tachycardia, 124, inc rbbb, antsept infarct - no acute changes. Tele: sinus rhythm  sinus tachy.  Assessment & Plan    1.  Elevated troponin: Pt presented 8/2 after being found down @ home for unclear amount of time with AMS.  She has no recollection of events surrounding fall or how she ended up in the hospital.  W/u here included neg head CT/MRI.  CK elevated @ 427.  Trop has been mildly elevated - currently @ 0.25.  Pt denies any prior h/o chest pain or dyspnea.  ECG non-acute.  With mild CK elevation, suspect some degree of rhabdo is present.  Echo pending.  Provided that this is nl, would not pursue additional ischemic eval.  I have called her daughter Rinaldo Cloudamela, to discuss, however there was no answer.  2.  Altered Mental Status/Fall:  Pt has no recollection of events surrounding fall/admission.  Imaging non-acute.  Carotids w/o significant dzs.  No events on tele.  Per IM.  3.  Hypothyroidism:  TSH mildly suppressed.  Synthroid adjustment @ discretion of IM.  4.  Hypokalemia:  supp.  Signed, Nicolasa Duckinghristopher Rene Gonsoulin, NP 12/22/2016, 10:44 AM

## 2016-12-22 NOTE — Progress Notes (Signed)
[employer] [facility] [place] [facility]   PATIENT NAME: Maria Walker    MR#:  [id number]  DATE OF BIRTH:  05-Jun-1926  SUBJECTIVE:  CHIEF COMPLAINT:   Chief Complaint  Patient presents with  . Fall    Brought with gradual worsening in activities and eating. Pt says" She want to die". Due to gradual weakness, she had a fall and brought here. Troponin little high and was noted to have some tachycardia and tachypnea.  REVIEW OF SYSTEMS:   Pt is confused with dementia, so can not give ROS.  ROS  DRUG ALLERGIES:   Allergies  Allergen Reactions  . Codeine Other (See Comments)    Reaction:  Hallucinations   . Prevnar [Pneumococcal 13-Val Conj Vacc]     Local reaction left arm 9/17     VITALS:  Blood pressure (!) 179/82, pulse 80, temperature 99 F (37.2 C), temperature source Oral, resp. rate 16, height 4\' 11"  (1.499 m), weight 42.6 kg (94 lb), SpO2 93 %.  PHYSICAL EXAMINATION:  GENERAL:  83 y.o.-year-old thin patient lying in the bed with no acute distress.  EYES: Pupils equal, round, reactive to light and accommodation. No scleral icterus. Extraocular muscles intact.  HEENT: Head atraumatic, normocephalic. Oropharynx and nasopharynx clear.  NECK:  Supple, no jugular venous distention. No thyroid enlargement, no tenderness.  LUNGS: Normal breath sounds bilaterally, no wheezing, rales,rhonchi or crepitation. No use of accessory muscles of respiration.  CARDIOVASCULAR: S1, S2 fast. No murmurs, rubs, or gallops.  ABDOMEN: Soft, nontender, nondistended. Bowel sounds present. No organomegaly or mass.  EXTREMITIES: No pedal edema, cyanosis, or clubbing.  NEUROLOGIC: Cranial nerves II through XII are intact. Muscle strength 3-4/5 in all extremities. Sensation intact. Gait not checked.  PSYCHIATRIC: The patient is alert and oriented x 3.  SKIN: No obvious rash, lesion, or ulcer.   Physical Exam LABORATORY PANEL:   CBC  Recent Labs Lab 12/22/16 0256  WBC 4.6  HGB  10.5*  HCT 31.2*  PLT 167   ------------------------------------------------------------------------------------------------------------------  Chemistries   Recent Labs Lab 12/21/16 0821 12/22/16 0256  NA 129* 130*  K 3.8 3.2*  CL 95* 98*  CO2 24 21*  GLUCOSE 138* 98  BUN 16 14  CREATININE 1.01* 0.83  CALCIUM 8.1* 7.8*  AST 73*  --   ALT 34  --   ALKPHOS 72  --   BILITOT 1.3*  --    ------------------------------------------------------------------------------------------------------------------  Cardiac Enzymes  Recent Labs Lab 12/22/16 0256 12/22/16 0647  TROPONINI 0.19* 0.25*   ------------------------------------------------------------------------------------------------------------------  RADIOLOGY:  Ct Head Wo Contrast  Result Date: 12/21/2016 CLINICAL DATA:  Un witnessed fall.  TIA, initial exam. EXAM: CT HEAD WITHOUT CONTRAST TECHNIQUE: Contiguous axial images were obtained from the base of the skull through the vertex without intravenous contrast. COMPARISON:  Head CT 07/02/2015 FINDINGS: Brain: Scattered areas of hypodensity in the white matter. Findings are similar to the previous examination and suggestive for chronic changes. No evidence for acute hemorrhage, mass lesion, midline shift, hydrocephalus or large infarct. Vascular: No hyperdense vessel or unexpected calcification. Skull: Normal. Negative for fracture or focal lesion. Sinuses/Orbits: Small amount of mucosal disease and in the posterior ethmoid air cells bilaterally. Small amount of mucosal disease in left sphenoid sinus. Sinus disease distribution is similar to the previous examination. Other: None. IMPRESSION: No acute intracranial abnormality. Scattered hypodensities in the white matter are similar to the previous examination. Findings probably represent chronic small vessel ischemic disease. Chronic sinus disease. Electronically Signed  By: Richarda OverlieAdam  Henn M.D.   On: 12/21/2016 08:49   Mr Brain Wo  Contrast  Result Date: 12/21/2016 CLINICAL DATA:  83 y/o F; TIA, initial exam. Patient found on floor. EXAM: MRI HEAD WITHOUT CONTRAST TECHNIQUE: Multiplanar, multiecho pulse sequences of the brain and surrounding structures were obtained without intravenous contrast. COMPARISON:  12/21/2016 CT of the head. FINDINGS: Brain: No acute infarction, hemorrhage, hydrocephalus, extra-axial collection or mass lesion. Nonspecific foci of T2 FLAIR hyperintense signal abnormality in subcortical and periventricular white matter are compatible with moderate chronic microvascular ischemic changes and there is moderate brain parenchymal volume loss. Vascular: Normal flow voids. Skull and upper cervical spine: Normal marrow signal. Sinuses/Orbits: No abnormal signal of paranasal sinuses or mastoid air cells. Right intra-ocular lens replacement. Other: None. IMPRESSION: 1. No acute intracranial abnormality identified. 2. Moderate chronic microvascular ischemic changes and moderate parenchymal volume loss of the brain. Electronically Signed   By: Mitzi HansenLance  Furusawa-Stratton M.D.   On: 12/21/2016 17:30   [place] Carotid Bilateral  Result Date: 12/21/2016 CLINICAL DATA:  83 year old female with symptoms of cerebrovascular accident EXAM: BILATERAL CAROTID DUPLEX ULTRASOUND TECHNIQUE: Wallace CullensGray scale imaging, color Doppler and duplex ultrasound were performed of bilateral carotid and vertebral arteries in the neck. COMPARISON:  Head CT 12/21/2016 FINDINGS: Criteria: Quantification of carotid stenosis is based on velocity parameters that correlate the residual internal carotid diameter with NASCET-based stenosis levels, using the diameter of the distal internal carotid lumen as the denominator for stenosis measurement. The following velocity measurements were obtained: RIGHT ICA:  53/15 cm/sec CCA:  126/15 cm/sec SYSTOLIC ICA/CCA RATIO:  0.4 DIASTOLIC ICA/CCA RATIO:  0.9 ECA:  79 cm/sec LEFT ICA:  74/19 cm/sec CCA:  82/10 cm/sec SYSTOLIC ICA/CCA  RATIO:  0.9 DIASTOLIC ICA/CCA RATIO:  1.8 ECA:  83 cm/sec RIGHT CAROTID ARTERY: Trace heterogeneous atherosclerotic plaque. By peak systolic velocity criteria the estimated stenosis remains less than 50%. RIGHT VERTEBRAL ARTERY:  Patent with normal antegrade flow. LEFT CAROTID ARTERY: Mild heterogeneous atherosclerotic plaque with calcification. By peak systolic velocity criteria the estimated stenosis remains less than 50%. LEFT VERTEBRAL ARTERY:  Patent with normal antegrade flow. IMPRESSION: 1. Mild (1-49%) stenosis proximal right internal carotid artery secondary to trace heterogeneous atherosclerotic plaque. 2. Mild (1-49%) stenosis proximal left internal carotid artery secondary to focal heterogeneous and partially calcified atherosclerotic plaque. 3. Vertebral arteries are patent with antegrade flow. Signed, Sterling BigHeath K. McCullough, MD [facility] Electronically Signed   By: Malachy MoanHeath  McCullough M.D.   On: 12/21/2016 16:36   Dg Chest Portable 1 View  Result Date: 12/21/2016 CLINICAL DATA:  Unwitnessed fall with patient being found on the floor in the living room. EXAM: PORTABLE CHEST 1 VIEW COMPARISON:  Chest x-ray of July 02, 2015 FINDINGS: The lungs are adequately inflated. The interstitial markings are coarse and slightly more conspicuous overall than on the previous study. There is no alveolar infiltrate. There may be a trace of pleural fluid at the right lung base. The heart is normal in size. The pulmonary vascularity is not engorged. There is calcification in the wall of the thoracic aorta. The observed bony thorax exhibits no acute abnormality. IMPRESSION: Chronic interstitial changes in both lungs with slight overall increase which may reflect mild interstitial edema of cardiac or noncardiac cause. There is stable scarring at the lung bases greatest on the left. Possible tiny right pleural effusion. Thoracic aortic atherosclerosis.  Electronically Signed   By: David  SwazilandJordan M.D.   On: 12/21/2016 08:46  ASSESSMENT AND PLAN:   Active Problems:   CVA (cerebral vascular accident) (HCC)  83 year old female with past medical history of hypothyroidism, chronic lung disease who presented to the hospital after a fall and being found on the floor. * Bronchitis   IV rocephin, monitor.  * Rhabdomyolysis   Status post fall- get physical therapy to evaluate patient.   IV fluids and check CK tomorrow.  * Elevated troponin-likely supply demand ischemia from tachycardia   And also have rhabdomyolysis. -We'll observe on telemetry, cycle her cardiac markers, check two-dimensional echocardiogram.   Appreciated cardiology consult, no EKG changes.  * TIA/CVA ruled out-suspected on admisison given patient's slurred speech after her fall and also some left-sided facial drooping. -CT head is negative for acute pathology. negative MRI of the brain, carotid duplex have b/l < 50 % stenosis, echocardiogram awaited. -Start patient on aspirin, get a physical therapy consult. If patient is positive for CVA would consider getting a neurology consult.  * Hypothyroidism-continue Synthroid.  * Hyponatremia-mild and patient is asymptomatic. We'll gently hydrate with IV fluids, follow sodium.  *  Acute kidney injury-secondary to dehydration, we'll hydrate with IV fluids, follow BUN and creatinine urine output.  Pt says- " she want to die, and not to be burden on anyone." " she can not accomplish anything now, had been very active whole life."  I have discussed this with her daughter in room and called psych and palliative care consult.  All the records are reviewed and case discussed with Care Management/Social Workerr. Management plans discussed with the patient, family and they are in agreement.  CODE STATUS: DNR  TOTAL TIME TAKING CARE OF THIS PATIENT: 35 minutes.     POSSIBLE D/C IN 1-2 DAYS, DEPENDING ON CLINICAL  CONDITION.   Altamese Dilling M.D on 12/22/2016   Between 7am to 6pm - Pager - [phone]  After 6pm go to [email or web address] - password [email or web address]  [facility]  Office  [phone]  CC: Primary care physician; Karie Schwalbe, MD  Note: This dictation was prepared with Dragon dictation along with smaller phrase technology. Any transcriptional errors that result from this process are unintentional.

## 2016-12-22 NOTE — Progress Notes (Signed)
New referral for [facility] services at home [facility]. Patient is an 83 year old woman with a PMH of hypothyroidsim, chronic lung disease and falls [facility] following a fall, being found on the floor. She was found to have an elevated troponin. She has received an echocardiogram and ASA for stroke work up. Cardiology was consulted as was palliative medicine. Her daughter has chosen to focus on her comfort and the plan is for discharge home with hospice services. Patient seen lying in the hospital bed, appears thin and frail, alert and oriented, some what interactive through out the visit. Daughter Rinaldo Cloudamela present in the room, Clinical research associatewriter initiated education regarding hospice services, philosophy and team approach to care with good understanding voiced. No DME needs at this time.PAtient information faxed to referral. Plan is for discharge home tomorrow 8/4. Patient information faxed to referral. Thank you. Dayna BarkerKaren Robertson RN, BSN, [facility], hospital Liaison  [phone] c

## 2016-12-22 NOTE — Consult Note (Signed)
[place] [facility] Consult   Reason for Consult:  Consult for 83 year old woman with no past psychiatric history because of concern about her making statements about being "ready to die" Referring Physician:  Anselm Jungling Patient Identification: Maria Walker MRN:  [id number] Principal Diagnosis: Adjustment disorder, unspecified Diagnosis:   Patient Active Problem List   Diagnosis Date Noted  . Adjustment disorder, unspecified [F43.20] 12/22/2016  . CVA (cerebral vascular accident) ([place]) [I63.9] 12/21/2016  . Immunization reaction [T50.Z95A] 02/16/2016  . Preventative health care [Z00.00] 02/07/2016  . Fracture of left wrist with routine healing [S62.102D] 02/07/2016  . Osteoporosis [M81.0] 02/07/2016  . Advance directive discussed with patient [Z71.89] 02/07/2016  . Mild malnutrition ([place]) [E44.1] 07/28/2015  . Hypothyroidism [E03.9]   . Chronic lung disease [J98.4]   . Abnormal TSH [R94.6] 07/03/2015  . Hyponatremia [E87.1] 07/02/2015    Total Time spent with patient: 1 hour  Subjective:   Maria Walker is a 83 y.o. female patient admitted with "I fell down".  HPI:  Patient interviewed chart reviewed. Spoke with hospitalist spoke with nursing and spoke with palliative care. This is an 83 year old woman in the hospital after a fall at home. Discharge planning is in progress. Patient had made statements about being "ready to die". I was asked to see her to assess whether there was a treatable psychiatric illness or anything that needed psychiatric intervention. Patient was only partially cooperative with the interview. Answered most questions perfunctorily. After a short period of time began to answer most questions by telling me that she didn't think I had any reason to be talking with her. Patient says that she is ready to die because she is 83 years old and doesn't feel like she needs to be living anymore. On the other hand she denies that she has a depressed mood. She says that she  has positive things in her life that are worth living for. She says that she appreciates spending time with her family and being involved with her church. Patient claims that she is eating normally and sleeping normally. She absolutely denies any thought or intention of doing anything to try to harm herself. She says that she is trying to eat as much as she feels comfortable doing and has been cooperative with medical care. Does not report any psychotic symptoms. Patient appears to be alert and oriented to her situation.  Social history: She tells me that she had been living alone before coming into the hospital. Closest relatives are her daughter and granddaughter. She says that she would like to go back to her own home.  Medical history: Patient tells me she has avoided medical care throughout her life. She claims that she takes no medication at home at all. Was found to have some electrolyte abnormalities and a fall. In the hospital.  Substance abuse history: Denies any  Past Psychiatric History: Patient denies any past psychiatric history whatsoever. Never seen a psychiatrist or mental health provider of any sort. Never prescribed any psychiatric medicine. She claims that she has never had any significant mental health symptoms.  Risk to Self: Is patient at risk for suicide?: No Risk to Others:   Prior Inpatient Therapy:   Prior Outpatient Therapy:    Past Medical History:  Past Medical History:  Diagnosis Date  . Apical lung scarring   . Chronic lung disease    from past pneumonia  . Hyponatremia    with cessation of thyroid replacement  . Hypothyroidism    after  goiter surgery    Past Surgical History:  Procedure Laterality Date  . APPENDECTOMY    . HIP SURGERY  2014   Left partial hip  . THYROIDECTOMY, PARTIAL  1948   goiter   Family History:  Family History  Problem Relation Age of Onset  . Hypertension Unknown            Family Psychiatric  History: Denies any Social  History:  History  Alcohol Use No     History  Drug Use No    Social History   Social History  . Marital status: Widowed    Spouse name: N/A  . Number of children: 3  . Years of education: N/A   Occupational History  . Office work     Retired   Social History Main Topics  . Smoking status: Never Smoker  . Smokeless tobacco: Never Used  . Alcohol use No  . Drug use: No  . Sexual activity: Not Asked   Other Topics Concern  . None   Social History Narrative   Widowed 1989. Lives locally by herself. Family nearby.  Pt still drives and does her own grocery shopping.      No living will   Daughter Jeannene Patella is her health care POA   Has DNR already   No tube feeds if cognitively unaware   Additional Social History:    Allergies:   Allergies  Allergen Reactions  . Codeine Other (See Comments)    Reaction:  Hallucinations   . Prevnar [Pneumococcal 13-Val Conj Vacc]     Local reaction left arm 9/17     Labs:  Results for orders placed or performed during the hospital encounter of 12/21/16 (from the past 48 hour(s))  Comprehensive metabolic panel     Status: Abnormal   Collection Time: 12/21/16  8:21 AM  Result Value Ref Range   Sodium 129 (L) 135 - 145 mmol/L   Potassium 3.8 3.5 - 5.1 mmol/L    Comment: HEMOLYSIS AT THIS LEVEL MAY AFFECT RESULT   Chloride 95 (L) 101 - 111 mmol/L   CO2 24 22 - 32 mmol/L   Glucose, Bld 138 (H) 65 - 99 mg/dL   BUN 16 6 - 20 mg/dL   Creatinine, Ser 1.01 (H) 0.44 - 1.00 mg/dL   Calcium 8.1 (L) 8.9 - 10.3 mg/dL   Total Protein 7.6 6.5 - 8.1 g/dL   Albumin 3.5 3.5 - 5.0 g/dL   AST 73 (H) 15 - 41 U/L   ALT 34 14 - 54 U/L   Alkaline Phosphatase 72 38 - 126 U/L   Total Bilirubin 1.3 (H) 0.3 - 1.2 mg/dL   GFR calc non Af Amer 48 (L) >60 mL/min   GFR calc Af Amer 56 (L) >60 mL/min    Comment: (NOTE) The eGFR has been calculated using the CKD EPI equation. This calculation has not been validated in all clinical situations. eGFR's  persistently <60 mL/min signify possible Chronic Kidney Disease.    Anion gap 10 5 - 15  Brain natriuretic peptide     Status: Abnormal   Collection Time: 12/21/16  8:21 AM  Result Value Ref Range   B Natriuretic Peptide 343.0 (H) 0.0 - 100.0 pg/mL  Troponin I     Status: Abnormal   Collection Time: 12/21/16  8:21 AM  Result Value Ref Range   Troponin I 0.04 (HH) <0.03 ng/mL    Comment: CRITICAL RESULT CALLED TO, READ BACK BY AND VERIFIED WITH JANIE  BOWEN 12/21/16 0927 KLW   CBC with Differential     Status: Abnormal   Collection Time: 12/21/16  8:21 AM  Result Value Ref Range   WBC 6.2 3.6 - 11.0 K/uL   RBC 3.97 3.80 - 5.20 MIL/uL   Hemoglobin 11.0 (L) 12.0 - 16.0 g/dL   HCT 33.4 (L) 35.0 - 47.0 %   MCV 84.1 80.0 - 100.0 fL   MCH 27.6 26.0 - 34.0 pg   MCHC 32.9 32.0 - 36.0 g/dL   RDW 14.1 11.5 - 14.5 %   Platelets 166 150 - 440 K/uL   Neutrophils Relative % 84 %   Neutro Abs 5.2 1.4 - 6.5 K/uL   Lymphocytes Relative 6 %   Lymphs Abs 0.4 (L) 1.0 - 3.6 K/uL   Monocytes Relative 10 %   Monocytes Absolute 0.6 0.2 - 0.9 K/uL   Eosinophils Relative 0 %   Eosinophils Absolute 0.0 0 - 0.7 K/uL   Basophils Relative 0 %   Basophils Absolute 0.0 0 - 0.1 K/uL  CK     Status: Abnormal   Collection Time: 12/21/16  8:21 AM  Result Value Ref Range   Total CK 427 (H) 38 - 234 U/L  TSH     Status: Abnormal   Collection Time: 12/21/16  8:21 AM  Result Value Ref Range   TSH 0.167 (L) 0.350 - 4.500 uIU/mL    Comment: Performed by a 3rd Generation assay with a functional sensitivity of <=0.01 uIU/mL.  Lactic acid, plasma     Status: Abnormal   Collection Time: 12/21/16  8:22 AM  Result Value Ref Range   Lactic Acid, Venous 2.3 (HH) 0.5 - 1.9 mmol/L    Comment: CRITICAL RESULT CALLED TO, READ BACK BY AND VERIFIED WITH JANICE WIBOEN 12/21/16 0905 KLW   Urinalysis, Complete w Microscopic     Status: Abnormal   Collection Time: 12/21/16  8:22 AM  Result Value Ref Range   Color, Urine  YELLOW (A) YELLOW   APPearance CLEAR (A) CLEAR   Specific Gravity, Urine 1.011 1.005 - 1.030   pH 6.0 5.0 - 8.0   Glucose, UA 50 (A) NEGATIVE mg/dL   Hgb urine dipstick MODERATE (A) NEGATIVE   Bilirubin Urine NEGATIVE NEGATIVE   Ketones, ur 5 (A) NEGATIVE mg/dL   Protein, ur 30 (A) NEGATIVE mg/dL   Nitrite NEGATIVE NEGATIVE   Leukocytes, UA NEGATIVE NEGATIVE   RBC / HPF 0-5 0 - 5 RBC/hpf   WBC, UA 0-5 0 - 5 WBC/hpf   Bacteria, UA NONE SEEN NONE SEEN   Squamous Epithelial / LPF 0-5 (A) NONE SEEN   Mucous PRESENT    Cellular Cast, UA PRESENT   Lactic acid, plasma     Status: None   Collection Time: 12/21/16  1:42 PM  Result Value Ref Range   Lactic Acid, Venous 1.3 0.5 - 1.9 mmol/L  Troponin I     Status: Abnormal   Collection Time: 12/21/16  1:42 PM  Result Value Ref Range   Troponin I 0.06 (HH) <0.03 ng/mL    Comment: CRITICAL VALUE NOTED. VALUE IS CONSISTENT WITH PREVIOUSLY REPORTED/CALLED VALUE [facility]  Troponin I     Status: Abnormal   Collection Time: 12/21/16  5:41 PM  Result Value Ref Range   Troponin I 0.08 (HH) <0.03 ng/mL    Comment: CRITICAL VALUE NOTED. VALUE IS CONSISTENT WITH PREVIOUSLY REPORTED/CALLED VALUE [place]  Troponin I     Status: Abnormal   Collection Time: 12/21/16  9:15 PM  Result Value Ref Range   Troponin I 0.13 (HH) <0.03 ng/mL    Comment: CRITICAL VALUE NOTED. VALUE IS CONSISTENT WITH PREVIOUSLY REPORTED/CALLED VALUE.  [facility]  Troponin I     Status: Abnormal   Collection Time: 12/21/16 10:59 PM  Result Value Ref Range   Troponin I 0.14 (HH) <0.03 ng/mL    Comment: CRITICAL VALUE NOTED. VALUE IS CONSISTENT WITH PREVIOUSLY REPORTED/CALLED VALUE [facility]  Basic metabolic panel     Status: Abnormal   Collection Time: 12/22/16  2:56 AM  Result Value Ref Range   Sodium 130 (L) 135 - 145 mmol/L   Potassium 3.2 (L) 3.5 - 5.1 mmol/L   Chloride 98 (L) 101 - 111 mmol/L   CO2 21 (L) 22 - 32 mmol/L   Glucose, Bld 98 65 - 99 mg/dL   BUN 14 6 - 20 mg/dL   Creatinine,  Ser 0.83 0.44 - 1.00 mg/dL   Calcium 7.8 (L) 8.9 - 10.3 mg/dL   GFR calc non Af Amer >60 >60 mL/min   GFR calc Af Amer >60 >60 mL/min    Comment: (NOTE) The eGFR has been calculated using the CKD EPI equation. This calculation has not been validated in all clinical situations. eGFR's persistently <60 mL/min signify possible Chronic Kidney Disease.    Anion gap 11 5 - 15  CBC     Status: Abnormal   Collection Time: 12/22/16  2:56 AM  Result Value Ref Range   WBC 4.6 3.6 - 11.0 K/uL   RBC 3.74 (L) 3.80 - 5.20 MIL/uL   Hemoglobin 10.5 (L) 12.0 - 16.0 g/dL   HCT 31.2 (L) 35.0 - 47.0 %   MCV 83.4 80.0 - 100.0 fL   MCH 28.1 26.0 - 34.0 pg   MCHC 33.7 32.0 - 36.0 g/dL   RDW 14.2 11.5 - 14.5 %   Platelets 167 150 - 440 K/uL  Troponin I     Status: Abnormal   Collection Time: 12/22/16  2:56 AM  Result Value Ref Range   Troponin I 0.19 (HH) <0.03 ng/mL    Comment: CRITICAL VALUE NOTED. VALUE IS CONSISTENT WITH PREVIOUSLY REPORTED/CALLED VALUE [facility]  Troponin I     Status: Abnormal   Collection Time: 12/22/16  6:47 AM  Result Value Ref Range   Troponin I 0.25 (HH) <0.03 ng/mL    Comment: CRITICAL VALUE NOTED. VALUE IS CONSISTENT WITH PREVIOUSLY REPORTED/CALLED VALUE...[facility]    Current Facility-Administered Medications  Medication Dose Route Frequency Provider Last Rate Last Dose  . 0.9 %  sodium chloride infusion   Intravenous Continuous Pershing Proud, NP 50 mL/hr at 12/22/16 1704    . acetaminophen (TYLENOL) tablet 650 mg  650 mg Oral Q6H PRN Henreitta Leber, MD   650 mg at 12/22/16 0844   Or  . acetaminophen (TYLENOL) suppository 650 mg  650 mg Rectal Q6H PRN Henreitta Leber, MD      . aspirin EC tablet 81 mg  81 mg Oral Daily Henreitta Leber, MD   81 mg at 12/22/16 0817  . cefTRIAXone (ROCEPHIN) 1 g in dextrose 5 % 50 mL IVPB  1 g Intravenous Q24H Vaughan Basta, MD   Stopped at 12/22/16 1015  . enoxaparin (LOVENOX) injection 30 mg  30 mg Subcutaneous Q24H Henreitta Leber, MD   30 mg at 12/21/16 2110  . hydrALAZINE (APRESOLINE) tablet 25 mg  25 mg Oral Q6H Minna Merritts, MD   25 mg at 12/22/16  1400  . levothyroxine (SYNTHROID, LEVOTHROID) tablet 100 mcg  100 mcg Oral QAC breakfast Henreitta Leber, MD   100 mcg at 12/22/16 0817  . metoprolol tartrate (LOPRESSOR) tablet 25 mg  25 mg Oral BID Vaughan Basta, MD   25 mg at 12/22/16 0844  . ondansetron (ZOFRAN) tablet 4 mg  4 mg Oral Q6H PRN Henreitta Leber, MD       Or  . ondansetron (ZOFRAN) injection 4 mg  4 mg Intravenous Q6H PRN Henreitta Leber, MD        Musculoskeletal: Strength & Muscle Tone: decreased and atrophy Gait & Station: unable to stand Patient leans: N/A  Psychiatric Specialty Exam: Physical Exam  Nursing note and vitals reviewed. Constitutional: She appears cachectic.  HENT:  Head: Normocephalic and atraumatic.  Eyes: Pupils are equal, round, and reactive to light. Conjunctivae are normal.  Neck: Normal range of motion.  Cardiovascular: Regular rhythm and normal heart sounds.   Respiratory: She is in respiratory distress.  GI: Soft.  Musculoskeletal: Normal range of motion.  Neurological: She is alert. She exhibits abnormal muscle tone.  Skin: Skin is warm and dry.  Psychiatric: Her speech is normal and behavior is normal. Judgment normal. Her affect is blunt. Thought content is not paranoid. She expresses no homicidal and no suicidal ideation.    Review of Systems  Constitutional: Negative.   HENT: Negative.   Eyes: Negative.   Respiratory: Negative.   Cardiovascular: Negative.   Gastrointestinal: Negative.   Musculoskeletal: Positive for falls.  Skin: Negative.   Neurological: Negative.   Psychiatric/Behavioral: Negative for depression, hallucinations, memory loss, substance abuse and suicidal ideas. The patient is not nervous/anxious and does not have insomnia.     Blood pressure (!) 179/82, pulse 80, temperature 99 F (37.2 C), temperature source Oral,  resp. rate 16, height 4' 11"  (1.499 m), weight 42.6 kg (94 lb), SpO2 93 %.Body mass index is 18.99 kg/m.  General Appearance: Casual  Eye Contact:  Fair  Speech:  Slow  Volume:  Decreased  Mood:  Euthymic  Affect:  Constricted  Thought Process:  Goal Directed  Orientation:  Full (Time, Place, and Person)  Thought Content:  Logical  Suicidal Thoughts:  No  Homicidal Thoughts:  No  Memory:  Immediate;   Fair Recent;   Fair Remote;   Fair  Judgement:  Fair  Insight:  Fair  Psychomotor Activity:  Decreased  Concentration:  Concentration: Fair  Recall:  [employer] of Knowledge:  Fair  Language:  Fair  Akathisia:  No  Handed:  Right  AIMS (if indicated):     Assets:  Communication Skills Housing Social Support  ADL's:  Impaired  Cognition:  Impaired,  Mild  Sleep:        Treatment Plan Summary: Plan 83 year old woman who is cachectic and has had a fall at home. Concern raised by the patient saying she was "ready to die". She repeats that to me but makes it clear to me that she has no intention of harming herself or killing herself. Patient has a blunted affect. Somewhat hard to make a full assessment of this as she clearly seemed to be displeased by having a psychiatric consult. Patient however explicitly denies feeling like she is "depressed". Can't completely rule out some degree of depression but at this point the question is probably moved. Patient does not meet commitment criteria and is not likely to benefit from inpatient psychiatric treatment and makes it clear that she does  not want any kind of treatment for depression. Patient is alert and oriented and understands her current medical situation. I tried to engage her in some conversation about what are the most important things that can be done for her going forward as far as palliative care. Patient was not able to name any except for getting out of the hospital. Palliative care staff is coming by to meet with the patient. No  indication for any psychiatric treatment or further follow-up or intervention.  Disposition: Patient does not meet criteria for psychiatric inpatient admission. Supportive therapy provided about ongoing stressors.  Alethia Berthold, MD 12/22/2016 5:21 PM

## 2016-12-22 NOTE — Progress Notes (Signed)
*  PRELIMINARY RESULTS* Echocardiogram 2D Echocardiogram has been performed.  Aprel Egelhoff 12/22/2016, 8:42 PM

## 2016-12-22 NOTE — Care Management Note (Signed)
Case Management Note  Patient Details  Name: Maria Walker MRN: [id number] Date of Birth: 20-Sep-1926  Subjective/Objective:   Admitted to [facility] under observation status with the diagnosis of CVA. Lives alone. Daughter is Rinaldo Cloudamela [phone]). Last seen Dr. Alphonsus SiasLetvak 01/2016. Seen Dr. Quillian QuinceBliss at her home around Christmas time. Rolling walker, raised toilet seat and  grab bars in the home. No home Health. [place], [address], and [address] in the past. No home oxygen. Caregiver (sitters) 7 days a week 8:00am-12 noon. Nurse in the home Monday, Tuesday, and Friday 2:00pm-5:00Pm. Always [facility] every Thursday for personal care. Video camera in the home that daughter monitor's when not in the home. Self feed with help, needs help with dressing and baths. Fell prior to admission.                 Action/Plan: Physical therapy and Palliative Care consult pending.  Spoke with daughter at the bedside. States she wants to take her mother home with palliative care and 24/7 care.   Expected Discharge Date:  12/23/16               Expected Discharge Plan:     In-House Referral:     Discharge planning Services     Post Acute Care Choice:    Choice offered to:     DME Arranged:    DME Agency:     HH Arranged:    HH Agency:     Status of Service:     If discussed at [employer] Meetings, dates discussed:    Additional Comments:  Gwenette GreetBrenda S Brenin Heidelberger, RNMSN CCM Care Management [phone] 12/22/2016, 9:43 AM

## 2016-12-22 NOTE — Telephone Encounter (Signed)
Debra with [facility] left v/m that pt is being discharging from hospital on 12/23/16 and they are recommending hospice service for pt; Hospice can take a verbal order and Stanton KidneyDebra is faxing a request also that could be signed. Stanton KidneyDebra is there until 5 PM after that call alternate main # [phone] and fax # is [phone]. Dr Alphonsus SiasLetvak out of office.

## 2016-12-22 NOTE — Consult Note (Signed)
Consultation Note Date: 12/22/2016   Patient Name: Maria Walker  DOB: [birth year]  MRN: [id number]  Age / Sex: 83 y.o., female  PCP: Maria Schwalbe, MD Referring Physician: Altamese Walker, *  Reason for Consultation: Establishing goals of care  HPI/Patient Profile: 83 y.o. female  with past medical history of chronic lung damage and scarring d/t past frequent pneumonias, hyponatremia, hypothyroidism admitted on 12/21/2016 with fall and found down by family. She has had worsening functional status and intake and was found to be very dehydrated. Palliative care requested to assist with GOC d/t declining functional status and patient expressing "I just want to die."  Clinical Assessment and Goals of Care: Maria Walker is sleepy when I came to visit. I spoke more with her daughter, Maria Walker tells me "my mother decided that she was going to die 6 years ago." Maria Walker has been very clear that she is tired, she has poor QOL, and has been hoping that God would just take her. Maria Walker tells me that she just wants to respect her mother's wishes and she has attempted to get hospice at her for home because she has felt like it is time since December. Maria Walker is very realistic and hopeful that her mother can be as comfortable and peaceful as possible and allowed to die in her home as she desires. Maria Walker has arranged for 24/7 care for her mother to have at home. I do believe hospice is very appropriate at this point.  Maria Walker tells me that her mother is not really eating and drinking much and has left mouth and eye droop and liquids will run out that side of her mouth at times. Maria Walker is convinced that her mother has had a stroke despite MRI results. There has certainly been an acute change. Maria Walker would like to have PT evaluation to know if her mother can still ambulate with a walker at all. She has also not been incontinent. Maria Walker is concerned about  anxiety and possible depression and may be interested in medication but would like to assess how her mother does over the next few days (discussed that hospice can address this at home as well). Therapeutic listening and emotional support provided.   Primary Decision Maker NEXT OF KIN [name]    SUMMARY OF RECOMMENDATIONS   - Home with hospice and comfort care  Code Status/Advance Care Planning:  DNR   Symptom Management:   Pain/discomfort: Utilize Tylenol prn. May utilize opioids if needed but this is very effective at this time.   Anxiety/depression: Consider antidepressant with treatment of chronic anxiety if consistent symptoms.   Poor intake: Encourage foods she likes. No artificial feeding desired.   Palliative Prophylaxis:   Aspiration, Bowel Regimen, Delirium Protocol, Frequent Pain Assessment, Oral Care and Turn Reposition  Additional Recommendations (Limitations, Scope, Preferences):  Full Comfort Care  Psycho-social/Spiritual:   Desire for further Chaplaincy support:no  Additional Recommendations: Caregiving  Support/Resources, Funeral Planning/Counseling and Grief/Bereavement Support  Prognosis:   < 6 weeks is likely.  Discharge Planning: Home with Hospice      Primary Diagnoses: Present on Admission: . CVA (cerebral vascular accident) (HCC)   I have reviewed the medical record, interviewed the patient and family, and examined the patient. The following aspects are pertinent.  Past Medical History:  Diagnosis Date  . Apical lung scarring   . Chronic lung disease    from past pneumonia  . Hyponatremia    with cessation of thyroid replacement  . Hypothyroidism    after goiter surgery   Social History   Social History  . Marital status: Widowed    Spouse name: N/A  . Number of children: 3  . Years of education: N/A   Occupational History  . Office work     Retired   Social History Main Topics  . Smoking status: Never  Smoker  . Smokeless tobacco: Never Used  . Alcohol use No  . Drug use: No  . Sexual activity: Not Asked   Other Topics Concern  . None   Social History Narrative   Widowed 1989. Lives locally by herself. Family nearby.  Pt still drives and does her own grocery shopping.      No living will   Daughter Elita Quickam is her health care POA   Has DNR already   No tube feeds if cognitively unaware   Family History  Problem Relation Age of Onset  . Hypertension Unknown            Scheduled Meds: . aspirin EC  81 mg Oral Daily  . enoxaparin (LOVENOX) injection  30 mg Subcutaneous Q24H  . hydrALAZINE  25 mg Oral Q6H  . levothyroxine  100 mcg Oral QAC breakfast  . metoprolol tartrate  25 mg Oral BID   Continuous Infusions: . sodium chloride 75 mL/hr at 12/22/16 0412  . cefTRIAXone (ROCEPHIN)  IV Stopped (12/22/16 1015)   PRN Meds:.acetaminophen **OR** acetaminophen, ondansetron **OR** ondansetron (ZOFRAN) IV Allergies  Allergen Reactions  . Codeine Other (See Comments)    Reaction:  Hallucinations   . Prevnar [Pneumococcal 13-Val Conj Vacc]     Local reaction left arm 9/17    Review of Systems  Unable to perform ROS: Acuity of condition    Physical Exam  Constitutional: She appears lethargic. She appears cachectic.  Cardiovascular: Regular rhythm.  Tachycardia present.   Pulmonary/Chest: Effort normal. No accessory muscle usage. No tachypnea. No respiratory distress.  Abdominal: Normal appearance.  Neurological: She appears lethargic.  Nursing note and vitals reviewed.   Vital Signs: BP (!) 179/82 (BP Location: Right Arm)   Pulse 80   Temp 99 F (37.2 C) (Oral)   Resp 16   Ht 4\' 11"  (1.499 m)   Wt 42.6 kg (94 lb)   SpO2 93%   BMI 18.99 kg/m  Pain Assessment: No/denies pain   Pain Score: Asleep   SpO2: SpO2: 93 % O2 Device:SpO2: 93 % O2 Flow Rate: .   IO: Intake/output summary:  Intake/Output Summary (Last 24 hours) at 12/22/16 1611 Last data filed at 12/22/16  1200  Gross per 24 hour  Intake          1318.75 ml  Output                0 ml  Net          1318.75 ml    LBM: Last BM Date: 12/20/16 Baseline Weight: Weight: 42.6 kg (94 lb) Most recent weight: Weight: 42.6 kg (94 lb)  Palliative Assessment/Data: 20-30%     Time Total: 70min Greater than 50%  of this time was spent counseling and coordinating care related to the above assessment and plan.  Signed by: Yong ChannelAlicia Mayjor Ager, NP Palliative Medicine Team Pager # [phone] (M-F 8a-5p) Team Phone # [phone] (Nights/Weekends)

## 2016-12-22 NOTE — Progress Notes (Signed)
Initial Nutrition Assessment  DOCUMENTATION CODES:   Severe malnutrition in context of chronic illness  INTERVENTION:  Provide Magic cup TID with meals, each supplement provides 290 kcal and 9 grams of protein.  Provide Chocolate Carnation Instant Breakfast in 2% milk TID with meals, each supplement provides 252 kcal and 13 grams of protein.  Will monitor outcome of discussions regarding goals of care.  NUTRITION DIAGNOSIS:   Malnutrition (Severe) related to social / environmental circumstances (advanced age, decreased appetite since December) as evidenced by severe depletion of body fat, severe depletion of muscle mass.  GOAL:   Patient will meet greater than or equal to 90% of their needs  MONITOR:   PO intake, Supplement acceptance, Labs, Weight trends, I & O's  REASON FOR ASSESSMENT:   Malnutrition Screening Tool    ASSESSMENT:   83 year old female with PMHx of hypothyroidism, chronic lung disease, hyponatremia who presents after a fall and being found on the floor. Admitted for suspected TIA/CVA in setting of slurred speech and left-sided facial drooping.   -Pending PMT consult.   Per chart patient lives alone at home. She has sitters 7 days a week from 8am to noon. There is an Charity fundraiserN in the home on Monday, Tuesday, and Friday 2pm to 5pm. [facility] is there every Thursday for personal care. Family has a video camera in the home.  Spoke with patient's daughter at bedside. Patient resting at time of assessment. Daughter reports that patient has never really "cared" about food and always thought of it as just fuel. However, patient has had a poor appetite since December. She is not interested in food or drinks and daughter believes she does not feel hungry or thirsty anymore. Patient eats two meals per day. The sitter feeds her at 11:00am. She usually eats scrambled eggs, bacon, and toast. Daughter reports that up until this past week when appetite worsened she was able to  finish that meal most days. Then the daughter comes to visit patient around 3:00pm and feeds her dinner. She usually has soup in a mug with a drink (milk, chocolate milk, or Ginger-ale). Patient also really enjoys sweets such as ice cream, brownies, or Werther's candy and will snack on them during the day. Daughter reports they have tried many oral nutrition supplements such as Ensure, Boost, and "diabetic" supplements (could not remember name), but they have all given pt diarrhea. She is amenable to having patient try B[employer] Group[employer] Cup.  Patient's weight has been decreasing. However, she has been small her whole life. Daughter reports she has never weighed more than 110 lbs or 115 lbs. Per chart patient was 103 lbs on 12/20/2015. She has lost 9 lbs (8.7% body weight) over the past year, which is not significant for time frame.  Meal Completion: 10% of dinner last night per chart  Medications reviewed and include: levothyroxine, NS @ 75 ml/hr, ceftriaxone.  Labs reviewed: Sodium 130, Potassium 3.2, Chloride 98, CO2 21, elevated Troponin.  Nutrition-Focused physical exam completed. Findings are severe fat depletion, severe muscle depletion, and no edema.   Discussed with RN.  Diet Order:  Diet regular Room service appropriate? Yes; Fluid consistency: Thin  Skin:  Reviewed, no issues  Last BM:  PTA (12/20/2016 per chart)  Height:   Ht Readings from Last 1 Encounters:  12/21/16 4\' 11"  (1.499 m)    Weight:   Wt Readings from Last 1 Encounters:  12/21/16 94 lb (42.6 kg)    Ideal Body Weight:  43.2  kg  BMI:  Body mass index is 18.99 kg/m.  Estimated Nutritional Needs:   Kcal:  1200-1400 (approximately 30-35 kcal/kg)  Protein:  64-72 grams (1.5-1.7 grams/kg)  Fluid:  1-1.2 L/day (25-30 ml/kg)  EDUCATION NEEDS:   No education needs identified at this time  Helane RimaLeanne Shanisha Lech, MS, RD, LDN Pager: [phone] After Hours Pager: [phone]

## 2016-12-22 NOTE — Telephone Encounter (Addendum)
Faxed to [facility].  Copy sent for scanning.

## 2016-12-22 NOTE — Telephone Encounter (Signed)
I agree after discussing her status with the daughter upon admission I will plan to make a home visit in the next couple of weeks

## 2016-12-22 NOTE — Care Management Obs Status (Signed)
MEDICARE OBSERVATION STATUS NOTIFICATION   Patient Details  Name: Maria Walker MRN: [id number] Date of Birth: 06-28-1926   Medicare Observation Status Notification Given:  Yes    Gwenette GreetBrenda S Trecia Maring, RN 12/22/2016, 9:37 AM

## 2016-12-22 NOTE — Progress Notes (Signed)
Palliative Medicine consult noted. Due to high referral volume, there may be a delay seeing this patient. Please call the Palliative Medicine Team office at [phone] if recommendations are needed in the interim.  Thank you for inviting us to see this patient.  Margret ChanceMelanie G. Almir Botts, RN, BSN, [facility] 12/22/2016 12:14 PM Cell [phone] 8:00-4:00 Monday-Friday Office [phone]

## 2016-12-22 NOTE — Progress Notes (Signed)
PT Cancellation Note  Patient Details Name: Maria Walker MRN: [id number] DOB: [birth year]   Cancelled Treatment:    Reason Eval/Treat Not Completed: Fatigue/lethargy limiting ability to participate (Consult received and chart reviewed.  Patient sleeping soundly upon arrival.  Daughter at bedside, requests therapist allow patient to rest "because of the horrible night she had last night".  Is interested in pursing therapy evaluation "to see if she is able to still walk after all this", but requests therapist wait until next date to re-attempt.  Chair set up left in room; will re-attempt next date per daughter's request.)   [name] H. Manson PasseyBrown, PT, DPT, NCS 12/22/16, 11:18 AM [phone]

## 2016-12-23 DIAGNOSIS — M6282 Rhabdomyolysis: Secondary | ICD-10-CM | POA: Diagnosis not present

## 2016-12-23 DIAGNOSIS — R404 Transient alteration of awareness: Secondary | ICD-10-CM | POA: Diagnosis not present

## 2016-12-23 DIAGNOSIS — Z7401 Bed confinement status: Secondary | ICD-10-CM | POA: Diagnosis not present

## 2016-12-23 DIAGNOSIS — J40 Bronchitis, not specified as acute or chronic: Secondary | ICD-10-CM | POA: Diagnosis not present

## 2016-12-23 DIAGNOSIS — W19XXXA Unspecified fall, initial encounter: Secondary | ICD-10-CM | POA: Diagnosis not present

## 2016-12-23 DIAGNOSIS — J209 Acute bronchitis, unspecified: Secondary | ICD-10-CM | POA: Diagnosis not present

## 2016-12-23 DIAGNOSIS — R531 Weakness: Secondary | ICD-10-CM | POA: Diagnosis not present

## 2016-12-23 DIAGNOSIS — R748 Abnormal levels of other serum enzymes: Secondary | ICD-10-CM | POA: Diagnosis not present

## 2016-12-23 LAB — BASIC METABOLIC PANEL
ANION GAP: 11 (ref 5–15)
BUN: 10 mg/dL (ref 6–20)
CALCIUM: 7.8 mg/dL — AB (ref 8.9–10.3)
CO2: 20 mmol/L — ABNORMAL LOW (ref 22–32)
Chloride: 95 mmol/L — ABNORMAL LOW (ref 101–111)
Creatinine, Ser: 0.58 mg/dL (ref 0.44–1.00)
GFR calc Af Amer: >60 mL/min (ref >60)
GLUCOSE: 143 mg/dL — AB (ref 65–99)
Potassium: 3.1 mmol/L — ABNORMAL LOW (ref 3.5–5.1)
SODIUM: 126 mmol/L — AB (ref 135–145)

## 2016-12-23 LAB — CK: CK TOTAL: 225 U/L (ref 38–234)

## 2016-12-23 LAB — ECHOCARDIOGRAM COMPLETE
Height: 59 in
Weight: 1504 oz

## 2016-12-23 LAB — MAGNESIUM: Magnesium: 1.5 mg/dL — ABNORMAL LOW (ref 1.7–2.4)

## 2016-12-23 MED ORDER — POTASSIUM CHLORIDE IN NACL 40-0.9 MEQ/L-% IV SOLN
INTRAVENOUS | Status: DC
  Administered 2016-12-23: 50 mL/h via INTRAVENOUS
  Filled 2016-12-23: qty 1000

## 2016-12-23 MED ORDER — ACETAMINOPHEN 325 MG PO TABS: 650.0000 mg | ORAL_TABLET | Freq: Four times a day (QID) | ORAL | 0 refills | Status: AC | PRN

## 2016-12-23 MED ORDER — METOPROLOL TARTRATE 25 MG PO TABS: 25.0000 mg | ORAL_TABLET | Freq: Two times a day (BID) | ORAL | 0 refills | Status: AC

## 2016-12-23 MED ORDER — POTASSIUM CHLORIDE CRYS ER 20 MEQ PO TBCR
40.0000 meq | EXTENDED_RELEASE_TABLET | Freq: Two times a day (BID) | ORAL | Status: DC
  Administered 2016-12-23: 40 meq via ORAL
  Filled 2016-12-23: qty 2

## 2016-12-23 MED ORDER — MAGNESIUM SULFATE 2 GM/50ML IV SOLN
2.0000 g | Freq: Once | INTRAVENOUS | Status: AC
  Administered 2016-12-23: 12:00:00 2 g via INTRAVENOUS
  Filled 2016-12-23: qty 50

## 2016-12-23 NOTE — Progress Notes (Signed)
Discussed discharge instructions and medications with patient and her daughter. IV removed. All questions addressed. EMS contacted for transport.  Daughter waiting at bedside with patient.  Orson Apeanielle Talley Kreiser, RN

## 2016-12-23 NOTE — Evaluation (Signed)
Physical Therapy Evaluation Patient Details Name: Maria Walker MRN: [id number] DOB: Oct 03, 1926 Today's Date: 12/23/2016   History of Present Illness  Pt is a 83yo F admitted to acute care on 8/2 with dx of unspecified adjustment disorder after experiencing a fall in the home. prior to admission, pt modI, amb with RW in the home. PMH: apical lung scarring, chronic lung diesease, hypoatremia, hypothyroidism, dementia, and OP.   Clinical Impression  Pt is pleasant and willing to participate.Pt slightly confused throughout session, though participated well. Pt performs bed mobility, tranfers, and ambulation with MinA, due to impaired strength, endurance, and balance. Amb total of 5 ft, requiring UE support from RW. Requires min cues for safety and mechanics.Overall, pt responded well to today's treatment with no adverse affects. Pt would benefit from skilled PT to address the previously mentioned impairments and promote return to PLOF. Currently recommending HHPT with 24/hr supervision/assistance, pending d/c. This entire session was guided, instructed, and directly supervised by Elizabeth PalauStephanie Ray, DPT.      Follow Up Recommendations Home health PT;Supervision/Assistance - 24 hour    Equipment Recommendations  None recommended by PT    Recommendations for Other Services       Precautions / Restrictions Precautions Precautions: Fall Restrictions Weight Bearing Restrictions: No      Mobility  Bed Mobility Overal bed mobility: Needs Assistance Bed Mobility: Supine to Sit     Supine to sit: Min assist     General bed mobility comments: MinA with bed mobility, requiring min verbal cues for mechancis and safety. Pt follows multistep commands well and demonstrates good carryover with functional mobility taks.   Transfers Overall transfer level: Needs assistance Equipment used: Rolling walker (2 wheeled) Transfers: Sit to/from Stand Sit to Stand: Min assist         General transfer  comment: Pt with minA to perform STS transfers, requiring increased time and min verbal cues for mechancis/hand placement and safety. No reports of dizziness upon standing.   Ambulation/Gait Ambulation/Gait assistance: Min assist Ambulation Distance (Feet): 5 Feet Assistive device: Rolling walker (2 wheeled) Gait Pattern/deviations: Shuffle     General Gait Details: Pt requires minA with amb, but has severely decreased gait velocity, requiring increased time to amb 5 ft to chair. Pt fatigued after amb short distances, and required min cues for mechancis and safety.   Stairs            Wheelchair Mobility    Modified Rankin (Stroke Patients Only)       Balance Overall balance assessment: Needs assistance Sitting-balance support: Bilateral upper extremity supported;Feet supported Sitting balance-Leahy Scale: Good Sitting balance - Comments: Pt with good sitting balance, requiring no cues for mechancis or safety. DOes requires supervision, as pt presents with slight increased postural sway, though is able to successfully self correct.    Standing balance support: Bilateral upper extremity supported Standing balance-Leahy Scale: Fair Standing balance comment: Fair standing balance with B UE support, requiring minA due to impaired strength and endurance. Min cues for safety and mechancis.                              Pertinent Vitals/Pain Pain Assessment: No/denies pain    Home Living Family/patient expects to be discharged to:: Private residence Living Arrangements: Alone Available Help at Discharge: Family Type of Home: House Home Access: Level entry     Home Layout: One level Home Equipment: Environmental consultantWalker - 2 wheels  Prior Function Level of Independence: Independent with assistive device(s)         Comments: Prior to admission, pt ModI, using RW to amb short distances in the home.       Hand Dominance        Extremity/Trunk Assessment   Upper  Extremity Assessment Upper Extremity Assessment: Generalized weakness (MMT to B UE's grossly 4/5)    Lower Extremity Assessment Lower Extremity Assessment: Generalized weakness (MMT to B LE's grossly 4/5.)       Communication   Communication: No difficulties  Cognition Arousal/Alertness: Awake/alert Behavior During Therapy: WFL for tasks assessed/performed Overall Cognitive Status: No family/caregiver present to determine baseline cognitive functioning                                 General Comments: Pt attempting to get out of bed upon SPT arrival. Pt education re: pressing call bell for assistance when attempting to get out of bed to prevent falls. Pt verbalized understadning. Pt slightly confused throughout session, continuously questioning SPT on where her burgundy chair went. However, pt follows multistep commands well.       General Comments      Exercises Other Exercises Other Exercises: Supine therex performed to B LE's with supervision x10 reps: ankle pumps, SLR, and glute sets. Pt required increased time to perform therex and min verbal cues for mechanics.    Assessment/Plan    PT Assessment Patient needs continued PT services  PT Problem List Decreased strength;Decreased activity tolerance;Decreased balance;Decreased mobility;Decreased coordination;Decreased knowledge of use of DME;Decreased safety awareness       PT Treatment Interventions DME instruction;Gait training;Functional mobility training;Therapeutic activities;Therapeutic exercise;Balance training;Neuromuscular re-education;Patient/family education;Manual techniques    PT Goals (Current goals can be found in the Care Plan section)  Acute Rehab PT Goals Patient Stated Goal: to go home  PT Goal Formulation: With patient/family Time For Goal Achievement: 01/06/17 Potential to Achieve Goals: Good    Frequency Min 2X/week   Barriers to discharge        Co-evaluation                AM-PAC PT "6 Clicks" Daily Activity  Outcome Measure Difficulty turning over in bed (including adjusting bedclothes, sheets and blankets)?: Total Difficulty moving from lying on back to sitting on the side of the bed? : Total Difficulty sitting down on and standing up from a chair with arms (e.g., wheelchair, bedside commode, etc,.)?: Total Help needed moving to and from a bed to chair (including a wheelchair)?: A Little Help needed walking in hospital room?: A Little Help needed climbing 3-5 steps with a railing? : Total 6 Click Score: 10    End of Session Equipment Utilized During Treatment: Gait belt Activity Tolerance: Patient tolerated treatment well Patient left: in chair;with call bell/phone within reach;with chair alarm set Nurse Communication: Mobility status PT Visit Diagnosis: Unsteadiness on feet (R26.81);Other abnormalities of gait and mobility (R26.89);Muscle weakness (generalized) (M62.81);History of falling (Z91.81)    Time: [id number] PT Time Calculation (min) (ACUTE ONLY): 19 min   Charges:         PT G Codes:        Sharman CheekLaura Claressa Hughley PT, SPT  Latanya MaudlinLaura M Darin Redmann 12/23/2016, 12:52 PM

## 2016-12-23 NOTE — Progress Notes (Signed)
Patient transported home via EMS.  Daughter at bedside and aware.  Orson Apeanielle Ilsa Bonello, RN

## 2016-12-23 NOTE — Care Management Note (Signed)
Case Management Note  Patient Details  Name: Harley Hallmarkdith S Streety MRN: [id number] Date of Birth: [birth year]  Subjective/Objective:     Ms Burnetta Sabinvey has been seen by Dayna BarkerKaren Robertson of [facility] and accepted as a patient with hospice services in the home. [facility]anda [facility] aware of discharge home today.                 Action/Plan:   Expected Discharge Date:  12/23/16               Expected Discharge Plan:   12/23/16  In-House Referral:     Discharge planning Services     Post Acute Care Choice:   YES Choice offered to:   Daughter and patient  DME Arranged:    DME Agency:     HH Arranged:   [facility] in home services HH Agency[facility]   [facility]  Status of Service:   Completed  If discussed at Long Length of Stay Meetings, dates discussed:    Additional Comments:  Khandi Kernes A, RN 12/23/2016, 10:49 AM

## 2016-12-23 NOTE — Discharge Summary (Signed)
[facility] [place] at [facility]   PATIENT NAME: Maria Walker    MR#:  [id number]  DATE OF BIRTH:  1927-04-17  DATE OF ADMISSION:  12/21/2016 ADMITTING PHYSICIAN: Houston SirenVivek J Sainani, MD  DATE OF DISCHARGE: 12/23/2016  PRIMARY CARE PHYSICIAN: Karie SchwalbeLetvak, Richard I, MD    ADMISSION DIAGNOSIS:  Tachycardia [R00.0] Elevated troponin I level [R74.8] Fall, initial encounter [W19.XXXA]  DISCHARGE DIAGNOSIS:   Bronchitis Rhabdomyolysis  Ac kidney injury  SECONDARY DIAGNOSIS:   Past Medical History:  Diagnosis Date  . Apical lung scarring   . Chronic lung disease    from past pneumonia  . Hyponatremia    with cessation of thyroid replacement  . Hypothyroidism    after goiter surgery    HOSPITAL COURSE:   83 year old female with past medical history of hypothyroidism, chronic lung disease who presented to the hospital after a fall and being found on the floor.  * Bronchitis   IV rocephin, monitor.  * Rhabdomyolysis   Status post fall- get physical therapy to evaluate patient.   IV fluids and check CK tomorrow.  * Elevated troponin-likely supply demand ischemia from tachycardia   And also have rhabdomyolysis. -We'll observe on telemetry, cycle her cardiac markers, check two-dimensional echocardiogram.   Appreciated cardiology consult, no EKG changes.  * TIA/CVA ruled out-suspected on admisison given patient's slurred speech after her fall and also some left-sided facial drooping. -CT head is negative for acute pathology. negative MRI of the brain, carotid duplex have b/l < 50 % stenosis, echocardiogram awaited. -Start patient on aspirin, get a physical therapy consult. If patient is positive for CVA would consider getting a neurology consult.  * Hypothyroidism-continue Synthroid.  * Hyponatremia-mild and patient is asymptomatic. We'll gently hydrate with IV fluids, follow sodium.  *  Acute kidney injury-secondary to dehydration, we'll hydrate  with IV fluids, follow BUN and creatinine urine output.  Pt says- " she want to die, and not to be burden on anyone." " she can not accomplish anything now, had been very active whole life."  I have discussed this with her daughter in room and called psych and palliative care consult.  After meeting with Psych and palliative care- family agreed to take pt home with hospice with 24 hr care taker.  DISCHARGE CONDITIONS:   Stable.  CONSULTS OBTAINED:  Treatment Team:  Antonieta IbaGollan, Timothy J, MD Clapacs, Jackquline DenmarkJohn T, MD  DRUG ALLERGIES:   Allergies  Allergen Reactions  . Codeine Other (See Comments)    Reaction:  Hallucinations   . Prevnar [Pneumococcal 13-Val Conj Vacc]     Local reaction left arm 9/17     DISCHARGE MEDICATIONS:   Current Discharge Medication List    START taking these medications   Details  acetaminophen (TYLENOL) 325 MG tablet Take 2 tablets (650 mg total) by mouth every 6 (six) hours as needed for mild pain (or Fever >/= 101). Qty: 30 tablet, Refills: 0    metoprolol tartrate (LOPRESSOR) 25 MG tablet Take 1 tablet (25 mg total) by mouth 2 (two) times daily. Qty: 60 tablet, Refills: 0      STOP taking these medications     levothyroxine (SYNTHROID, LEVOTHROID) 100 MCG tablet      Multiple Vitamins-Minerals (CENTRUM SILVER PO)      alendronate (FOSAMAX) 70 MG tablet          DISCHARGE INSTRUCTIONS:    Follow with PMD as needed, Hospice to follow at home.  If you experience worsening of  your admission symptoms, develop shortness of breath, life threatening emergency, suicidal or homicidal thoughts you must seek medical attention immediately by calling 911 or calling your MD immediately  if symptoms less severe.  You Must read complete instructions/literature along with all the possible adverse reactions/side effects for all the Medicines you take and that have been prescribed to you. Take any new Medicines after you have completely understood and accept  all the possible adverse reactions/side effects.   Please note  You were cared for by a hospitalist during your hospital stay. If you have any questions about your discharge medications or the care you received while you were in the hospital after you are discharged, you can call the unit and asked to speak with the hospitalist on call if the hospitalist that took care of you is not available. Once you are discharged, your primary care physician will handle any further medical issues. Please note that NO REFILLS for any discharge medications will be authorized once you are discharged, as it is imperative that you return to your primary care physician (or establish a relationship with a primary care physician if you do not have one) for your aftercare needs so that they can reassess your need for medications and monitor your lab values.    Today   CHIEF COMPLAINT:   Chief Complaint  Patient presents with  . Fall    HISTORY OF PRESENT ILLNESS:  Maria Walker  is a 83 y.o. female with a known history of Chronic lung disease, hypothyroidism, previous history of hyponatremia who presents to the hospital after a fall and being found on the floor. Patient cannot recall any of the events and therefore most of the history obtained from the family at bedside. As per the family the left her yesterday evening around 7 pm or so and they have cameras and watch her from their own home. The daughter did not see her from the cameras in her bedroom or in the living room and therefore came to see her and found her down near her kitchen. Patient cannot recall the events and she denies any prodromal symptoms prior to her fall. She was brought to the emergency room for further evaluation. Patient was noted to have a mildly elevated troponin and as per the family she also had a slight left-sided facial droop. Patient denies any headache, nausea, vomiting, numbness or tingling or any other associated symptoms presently.  Hospitalist services were contacted further treatment and evaluation.   VITAL SIGNS:  Blood pressure 114/70, pulse 89, temperature 98 F (36.7 C), temperature source Oral, resp. rate 16, height 4\' 11"  (1.499 m), weight 42.6 kg (94 lb), SpO2 95 %.  I/O:   Intake/Output Summary (Last 24 hours) at 12/23/16 1325 Last data filed at 12/23/16 1250  Gross per 24 hour  Intake             1639 ml  Output              125 ml  Net             1514 ml    PHYSICAL EXAMINATION:   GENERAL:  83 y.o.-year-old thin patient lying in the bed with no acute distress.  EYES: Pupils equal, round, reactive to light and accommodation. No scleral icterus. Extraocular muscles intact.  HEENT: Head atraumatic, normocephalic. Oropharynx and nasopharynx clear.  NECK:  Supple, no jugular venous distention. No thyroid enlargement, no tenderness.  LUNGS: Normal breath sounds bilaterally, no wheezing, rales,rhonchi or  crepitation. No use of accessory muscles of respiration.  CARDIOVASCULAR: S1, S2 fast. No murmurs, rubs, or gallops.  ABDOMEN: Soft, nontender, nondistended. Bowel sounds present. No organomegaly or mass.  EXTREMITIES: No pedal edema, cyanosis, or clubbing.  NEUROLOGIC: Cranial nerves II through XII are intact. Muscle strength 3-4/5 in all extremities. Sensation intact. Gait not checked.  PSYCHIATRIC: The patient is alert and oriented x 3.  SKIN: No obvious rash, lesion, or ulcer.   DATA REVIEW:   CBC  Recent Labs Lab 12/22/16 0256  WBC 4.6  HGB 10.5*  HCT 31.2*  PLT 167    Chemistries   Recent Labs Lab 12/21/16 0821  12/23/16 0428  NA 129*  < > 126*  K 3.8  < > 3.1*  CL 95*  < > 95*  CO2 24  < > 20*  GLUCOSE 138*  < > 143*  BUN 16  < > 10  CREATININE 1.01*  < > 0.58  CALCIUM 8.1*  < > 7.8*  MG  --   --  1.5*  AST 73*  --   --   ALT 34  --   --   ALKPHOS 72  --   --   BILITOT 1.3*  --   --   < > = values in this interval not displayed.  Cardiac Enzymes  Recent Labs Lab  12/22/16 0647  TROPONINI 0.25*    Microbiology Results  Results for orders placed or performed during the hospital encounter of 12/16/15  Urine culture     Status: Abnormal   Collection Time: 12/16/15  9:52 PM  Result Value Ref Range Status   Specimen Description URINE, CLEAN CATCH  Final   Special Requests Normal  Final   Culture MULTIPLE SPECIES PRESENT, SUGGEST RECOLLECTION (A)  Final   Report Status 12/18/2015 FINAL  Final    RADIOLOGY:  Mr Brain Wo Contrast  Result Date: 12/21/2016 CLINICAL DATA:  83 y/o F; TIA, initial exam. Patient found on floor. EXAM: MRI HEAD WITHOUT CONTRAST TECHNIQUE: Multiplanar, multiecho pulse sequences of the brain and surrounding structures were obtained without intravenous contrast. COMPARISON:  12/21/2016 CT of the head. FINDINGS: Brain: No acute infarction, hemorrhage, hydrocephalus, extra-axial collection or mass lesion. Nonspecific foci of T2 FLAIR hyperintense signal abnormality in subcortical and periventricular white matter are compatible with moderate chronic microvascular ischemic changes and there is moderate brain parenchymal volume loss. Vascular: Normal flow voids. Skull and upper cervical spine: Normal marrow signal. Sinuses/Orbits: No abnormal signal of paranasal sinuses or mastoid air cells. Right intra-ocular lens replacement. Other: None. IMPRESSION: 1. No acute intracranial abnormality identified. 2. Moderate chronic microvascular ischemic changes and moderate parenchymal volume loss of the brain. Electronically Signed   By: Mitzi Hansen M.D.   On: 12/21/2016 17:30   US Carotid Bilateral  Result Date: 12/21/2016 CLINICAL DATA:  83 year old female with symptoms of cerebrovascular accident EXAM: BILATERAL CAROTID DUPLEX ULTRASOUND TECHNIQUE: Wallace Cullens scale imaging, color Doppler and duplex ultrasound were performed of bilateral carotid and vertebral arteries in the neck. COMPARISON:  Head CT 12/21/2016 FINDINGS: Criteria: Quantification  of carotid stenosis is based on velocity parameters that correlate the residual internal carotid diameter with NASCET-based stenosis levels, using the diameter of the distal internal carotid lumen as the denominator for stenosis measurement. The following velocity measurements were obtained: RIGHT ICA:  53/15 cm/sec CCA:  126/15 cm/sec SYSTOLIC ICA/CCA RATIO:  0.4 DIASTOLIC ICA/CCA RATIO:  0.9 ECA:  79 cm/sec LEFT ICA:  74/19 cm/sec CCA:  82/10 cm/sec  SYSTOLIC ICA/CCA RATIO:  0.9 DIASTOLIC ICA/CCA RATIO:  1.8 ECA:  83 cm/sec RIGHT CAROTID ARTERY: Trace heterogeneous atherosclerotic plaque. By peak systolic velocity criteria the estimated stenosis remains less than 50%. RIGHT VERTEBRAL ARTERY:  Patent with normal antegrade flow. LEFT CAROTID ARTERY: Mild heterogeneous atherosclerotic plaque with calcification. By peak systolic velocity criteria the estimated stenosis remains less than 50%. LEFT VERTEBRAL ARTERY:  Patent with normal antegrade flow. IMPRESSION: 1. Mild (1-49%) stenosis proximal right internal carotid artery secondary to trace heterogeneous atherosclerotic plaque. 2. Mild (1-49%) stenosis proximal left internal carotid artery secondary to focal heterogeneous and partially calcified atherosclerotic plaque. 3. Vertebral arteries are patent with antegrade flow. Signed, Sterling BigHeath K. McCullough, MD [facility] Electronically Signed   By: Malachy MoanHeath  McCullough M.D.   On: 12/21/2016 16:36    EKG:   Orders placed or performed during the hospital encounter of 12/21/16  . EKG 12-Lead  . EKG 12-Lead  . ED EKG  . ED EKG      Management plans discussed with the patient, family and they are in agreement.  CODE STATUS:     Code Status Orders        Start     Ordered   12/21/16 1343  Do not attempt resuscitation (DNR)  Continuous    Question Answer Comment  In the event of cardiac or respiratory ARREST Do not call a "code blue"   In the event  of cardiac or respiratory ARREST Do not perform Intubation, CPR, defibrillation or ACLS   In the event of cardiac or respiratory ARREST Use medication by any route, position, wound care, and other measures to relive pain and suffering. May use oxygen, suction and manual treatment of airway obstruction as needed for comfort.      12/21/16 1343    Code Status History    Date Active Date Inactive Code Status Order ID Comments User Context   07/02/2015  8:36 PM 07/05/2015  8:20 PM DNR [id number]  Milagros LollSudini, Srikar, MD Inpatient   07/02/2015  4:21 PM 07/02/2015  8:36 PM Full Code [id number]  Auburn BilberryPatel, Shreyang, MD ED    Advance Directive Documentation     Most Recent Value  Type of Advance Directive  Healthcare Power of Attorney, Living will  Pre-existing out of facility DNR order (yellow form or pink MOST form)  -  "MOST" Form in Place?  -      TOTAL TIME TAKING CARE OF THIS PATIENT: 35 minutes.    Altamese DillingVACHHANI, Giabella Duhart M.D on 12/23/2016 at 1:25 PM  Between 7am to 6pm - Pager - [phone]  After 6pm go to [email or web address] - password [email or web address]  [facility]  Office  [phone]  CC: Primary care physician; Karie SchwalbeLetvak, Richard I, MD   Note: This dictation was prepared with Dragon dictation along with smaller phrase technology. Any transcriptional errors that result from this process are unintentional.

## 2016-12-23 NOTE — Care Management Note (Signed)
Case Management Note  Patient Details  Name: Maria Walker MRN: [id number] Date of Birth: 02-May-1927  Subjective/Objective:       Information for EMS with note to make sure that daughter Rinaldo Cloudamela is aware of discharge home today prior to [name] leaving the hospital.              Action/Plan:   Expected Discharge Date:  12/23/16               Expected Discharge Plan:   12/23/16  In-House Referral:     Discharge planning Services   home with [facility]  Post Acute Care Choice:   YES Choice offered to:   Daughter  DME Arranged:    DME Agency:     HH Arranged:    HH Agency:     Status of Service:    Completed. If discussed at Long Length of Stay Meetings, dates discussed:    Additional Comments:  Anmarie Fukushima A, RN 12/23/2016, 12:18 PM

## 2016-12-24 DIAGNOSIS — E039 Hypothyroidism, unspecified: Secondary | ICD-10-CM | POA: Diagnosis not present

## 2016-12-24 DIAGNOSIS — M6282 Rhabdomyolysis: Secondary | ICD-10-CM | POA: Diagnosis not present

## 2016-12-24 DIAGNOSIS — Z9181 History of falling: Secondary | ICD-10-CM | POA: Diagnosis not present

## 2016-12-24 DIAGNOSIS — R000 Tachycardia, unspecified: Secondary | ICD-10-CM | POA: Diagnosis not present

## 2016-12-24 DIAGNOSIS — I69354 Hemiplegia and hemiparesis following cerebral infarction affecting left non-dominant side: Secondary | ICD-10-CM | POA: Diagnosis not present

## 2016-12-24 DIAGNOSIS — Z681 Body mass index (BMI) 19 or less, adult: Secondary | ICD-10-CM | POA: Diagnosis not present

## 2016-12-24 DIAGNOSIS — J984 Other disorders of lung: Secondary | ICD-10-CM | POA: Diagnosis not present

## 2016-12-25 ENCOUNTER — Telehealth: Payer: Self-pay | Admitting: *Deleted

## 2016-12-25 DIAGNOSIS — I69354 Hemiplegia and hemiparesis following cerebral infarction affecting left non-dominant side: Secondary | ICD-10-CM | POA: Diagnosis not present

## 2016-12-25 DIAGNOSIS — R000 Tachycardia, unspecified: Secondary | ICD-10-CM | POA: Diagnosis not present

## 2016-12-25 DIAGNOSIS — M6282 Rhabdomyolysis: Secondary | ICD-10-CM | POA: Diagnosis not present

## 2016-12-25 DIAGNOSIS — Z681 Body mass index (BMI) 19 or less, adult: Secondary | ICD-10-CM | POA: Diagnosis not present

## 2016-12-25 DIAGNOSIS — J984 Other disorders of lung: Secondary | ICD-10-CM | POA: Diagnosis not present

## 2016-12-25 DIAGNOSIS — E039 Hypothyroidism, unspecified: Secondary | ICD-10-CM | POA: Diagnosis not present

## 2016-12-25 NOTE — Telephone Encounter (Addendum)
Debra with [facility] left v/m requesting the order for hospice and certification of terminal illness to be signed by Dr Alphonsus SiasLetvak as well so can have on pts chart. Stanton KidneyDebra did receive the fax signed by Dr Para Marchuncan.Please advise. The form was sent for scanning and has not shown in pts chart yet. I called Stanton KidneyDebra and asked that another order for hospice and certification of terminal illness be faxed to [phone] Atten: Carollee HerterShannon.

## 2016-12-25 NOTE — Telephone Encounter (Signed)
Form received, signed by Dr Alphonsus SiasLetvak, and faxed back

## 2016-12-25 NOTE — Telephone Encounter (Signed)
Lm with person at pt's home, requesting return call to complete TCM and confirm hosp f/u appt

## 2016-12-27 DIAGNOSIS — I69354 Hemiplegia and hemiparesis following cerebral infarction affecting left non-dominant side: Secondary | ICD-10-CM | POA: Diagnosis not present

## 2016-12-27 DIAGNOSIS — R000 Tachycardia, unspecified: Secondary | ICD-10-CM | POA: Diagnosis not present

## 2016-12-27 DIAGNOSIS — Z681 Body mass index (BMI) 19 or less, adult: Secondary | ICD-10-CM | POA: Diagnosis not present

## 2016-12-27 DIAGNOSIS — J984 Other disorders of lung: Secondary | ICD-10-CM | POA: Diagnosis not present

## 2016-12-27 DIAGNOSIS — M6282 Rhabdomyolysis: Secondary | ICD-10-CM | POA: Diagnosis not present

## 2016-12-27 DIAGNOSIS — E039 Hypothyroidism, unspecified: Secondary | ICD-10-CM | POA: Diagnosis not present

## 2016-12-29 DIAGNOSIS — I69354 Hemiplegia and hemiparesis following cerebral infarction affecting left non-dominant side: Secondary | ICD-10-CM | POA: Diagnosis not present

## 2016-12-29 DIAGNOSIS — R000 Tachycardia, unspecified: Secondary | ICD-10-CM | POA: Diagnosis not present

## 2016-12-29 DIAGNOSIS — Z681 Body mass index (BMI) 19 or less, adult: Secondary | ICD-10-CM | POA: Diagnosis not present

## 2016-12-29 DIAGNOSIS — M6282 Rhabdomyolysis: Secondary | ICD-10-CM | POA: Diagnosis not present

## 2016-12-29 DIAGNOSIS — J984 Other disorders of lung: Secondary | ICD-10-CM | POA: Diagnosis not present

## 2016-12-29 DIAGNOSIS — E039 Hypothyroidism, unspecified: Secondary | ICD-10-CM | POA: Diagnosis not present

## 2016-12-31 DIAGNOSIS — Z681 Body mass index (BMI) 19 or less, adult: Secondary | ICD-10-CM | POA: Diagnosis not present

## 2016-12-31 DIAGNOSIS — I69354 Hemiplegia and hemiparesis following cerebral infarction affecting left non-dominant side: Secondary | ICD-10-CM | POA: Diagnosis not present

## 2016-12-31 DIAGNOSIS — E039 Hypothyroidism, unspecified: Secondary | ICD-10-CM | POA: Diagnosis not present

## 2016-12-31 DIAGNOSIS — R000 Tachycardia, unspecified: Secondary | ICD-10-CM | POA: Diagnosis not present

## 2016-12-31 DIAGNOSIS — M6282 Rhabdomyolysis: Secondary | ICD-10-CM | POA: Diagnosis not present

## 2016-12-31 DIAGNOSIS — J984 Other disorders of lung: Secondary | ICD-10-CM | POA: Diagnosis not present

## 2017-01-01 DIAGNOSIS — E039 Hypothyroidism, unspecified: Secondary | ICD-10-CM | POA: Diagnosis not present

## 2017-01-01 DIAGNOSIS — Z681 Body mass index (BMI) 19 or less, adult: Secondary | ICD-10-CM | POA: Diagnosis not present

## 2017-01-01 DIAGNOSIS — M6282 Rhabdomyolysis: Secondary | ICD-10-CM | POA: Diagnosis not present

## 2017-01-01 DIAGNOSIS — R000 Tachycardia, unspecified: Secondary | ICD-10-CM | POA: Diagnosis not present

## 2017-01-01 DIAGNOSIS — J984 Other disorders of lung: Secondary | ICD-10-CM | POA: Diagnosis not present

## 2017-01-01 DIAGNOSIS — I69354 Hemiplegia and hemiparesis following cerebral infarction affecting left non-dominant side: Secondary | ICD-10-CM | POA: Diagnosis not present

## 2017-01-02 ENCOUNTER — Telehealth: Payer: Self-pay

## 2017-01-02 NOTE — Telephone Encounter (Signed)
Mitzi nurse with [facility] left v/m; pt is actively dying. Pt is contracted after a major stroke this past weekend and is requiring doses of morphine liquid; pt has enough med to get her thru tomorrow morning. Mitzi left v/m requesting new rx for morphine concentration 20 mg/ml  # 30 ml bottle with directions 0.5 ml q 2 h prn for pain. Pam, pts daughter knows not to pick up rx until tomorrow to see if pt lives thru the night. [facility]

## 2017-01-03 DIAGNOSIS — J984 Other disorders of lung: Secondary | ICD-10-CM | POA: Diagnosis not present

## 2017-01-03 DIAGNOSIS — Z681 Body mass index (BMI) 19 or less, adult: Secondary | ICD-10-CM | POA: Diagnosis not present

## 2017-01-03 DIAGNOSIS — M6282 Rhabdomyolysis: Secondary | ICD-10-CM | POA: Diagnosis not present

## 2017-01-03 DIAGNOSIS — I69354 Hemiplegia and hemiparesis following cerebral infarction affecting left non-dominant side: Secondary | ICD-10-CM | POA: Diagnosis not present

## 2017-01-03 DIAGNOSIS — R000 Tachycardia, unspecified: Secondary | ICD-10-CM | POA: Diagnosis not present

## 2017-01-03 DIAGNOSIS — E039 Hypothyroidism, unspecified: Secondary | ICD-10-CM | POA: Diagnosis not present

## 2017-01-03 MED ORDER — MORPHINE SULFATE (CONCENTRATE) 20 MG/ML PO SOLN: 10.0000 mg | ORAL | 0 refills | Status: AC | PRN

## 2017-01-03 NOTE — Telephone Encounter (Signed)
Rx faxed to [facility] per Mitzi

## 2017-01-03 NOTE — Telephone Encounter (Signed)
Please fax

## 2017-01-04 DIAGNOSIS — Z681 Body mass index (BMI) 19 or less, adult: Secondary | ICD-10-CM | POA: Diagnosis not present

## 2017-01-04 DIAGNOSIS — J984 Other disorders of lung: Secondary | ICD-10-CM | POA: Diagnosis not present

## 2017-01-04 DIAGNOSIS — I69354 Hemiplegia and hemiparesis following cerebral infarction affecting left non-dominant side: Secondary | ICD-10-CM | POA: Diagnosis not present

## 2017-01-04 DIAGNOSIS — M6282 Rhabdomyolysis: Secondary | ICD-10-CM | POA: Diagnosis not present

## 2017-01-04 DIAGNOSIS — R000 Tachycardia, unspecified: Secondary | ICD-10-CM | POA: Diagnosis not present

## 2017-01-04 DIAGNOSIS — E039 Hypothyroidism, unspecified: Secondary | ICD-10-CM | POA: Diagnosis not present

## 2017-01-05 DIAGNOSIS — R000 Tachycardia, unspecified: Secondary | ICD-10-CM | POA: Diagnosis not present

## 2017-01-05 DIAGNOSIS — I69354 Hemiplegia and hemiparesis following cerebral infarction affecting left non-dominant side: Secondary | ICD-10-CM | POA: Diagnosis not present

## 2017-01-05 DIAGNOSIS — J984 Other disorders of lung: Secondary | ICD-10-CM | POA: Diagnosis not present

## 2017-01-05 DIAGNOSIS — M6282 Rhabdomyolysis: Secondary | ICD-10-CM | POA: Diagnosis not present

## 2017-01-05 DIAGNOSIS — Z681 Body mass index (BMI) 19 or less, adult: Secondary | ICD-10-CM | POA: Diagnosis not present

## 2017-01-05 DIAGNOSIS — E039 Hypothyroidism, unspecified: Secondary | ICD-10-CM | POA: Diagnosis not present

## 2017-01-20 DEATH — deceased

## 2018-05-10 IMAGING — DX DG ANKLE COMPLETE 3+V*R*
3 series · 3 of 3 positions shown · non-contrast
Comparison: None.

CLINICAL DATA: Fall from standing at home this evening, c/o left
wrist, left elbow and right ankle pain

EXAM:
RIGHT ANKLE - COMPLETE 3+ VIEW

[ankle ap]
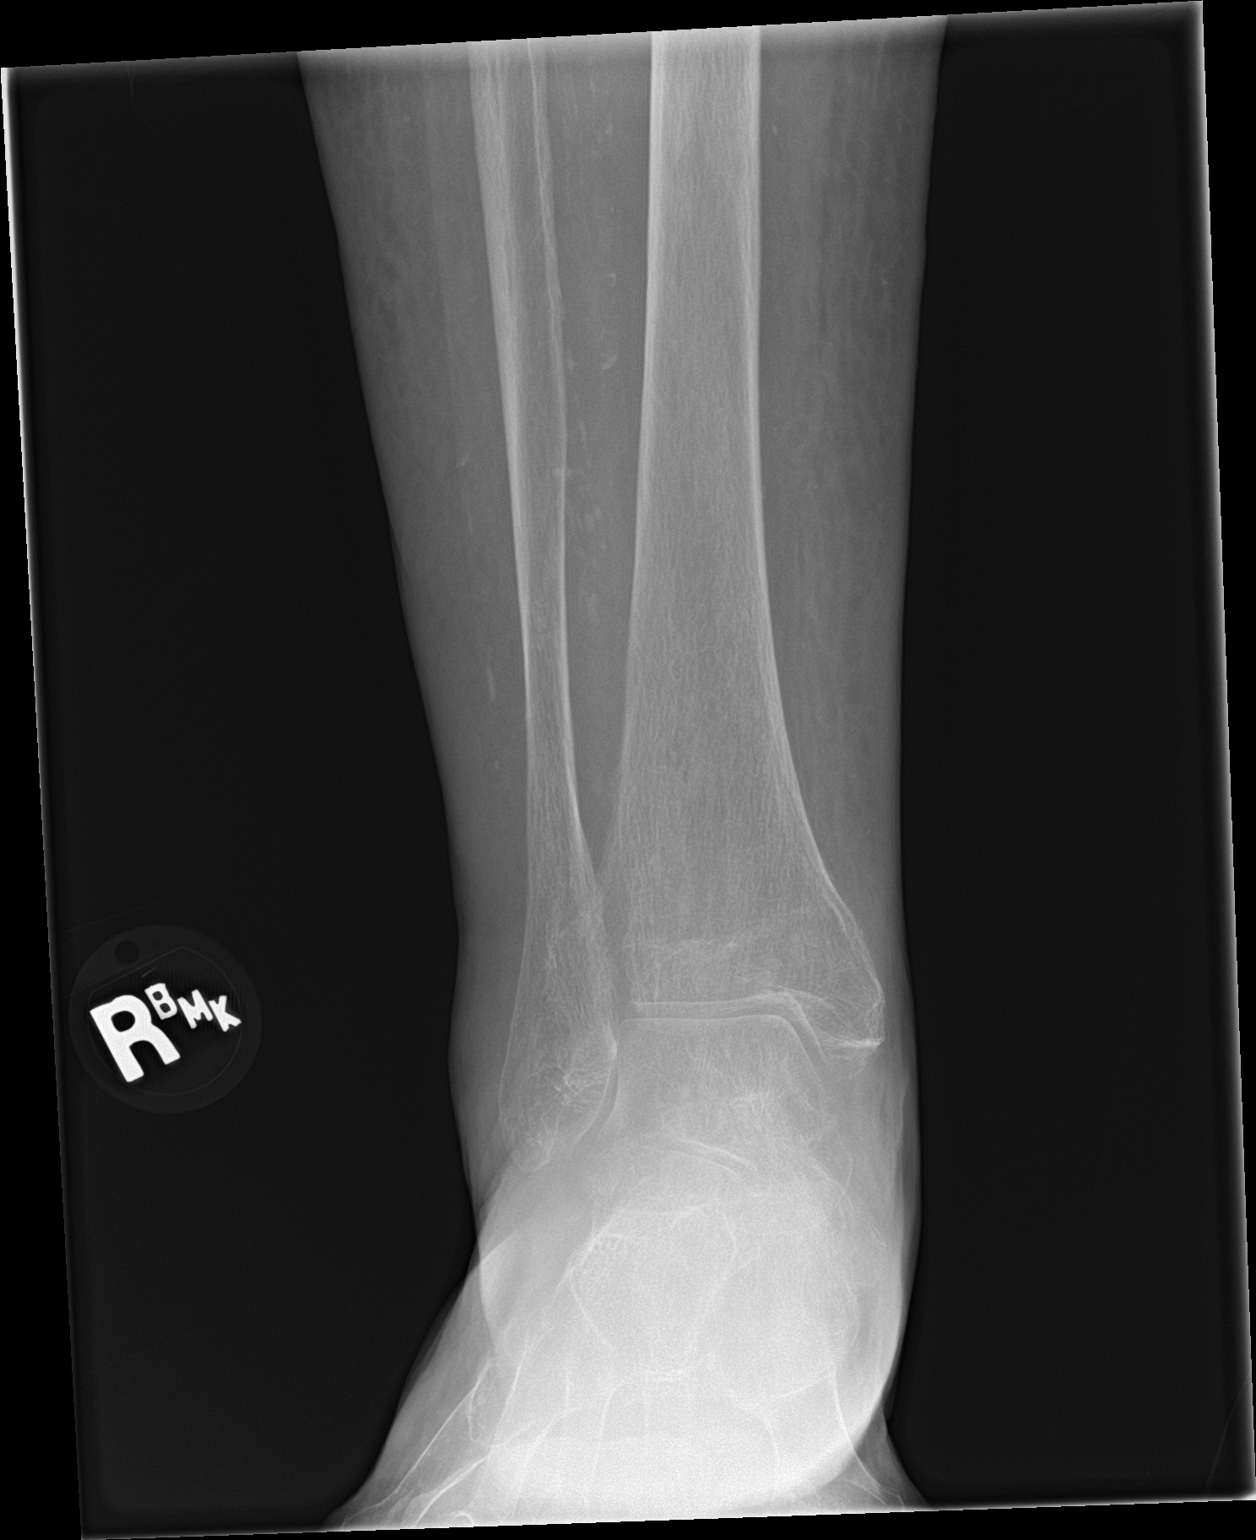

[ankle obl]
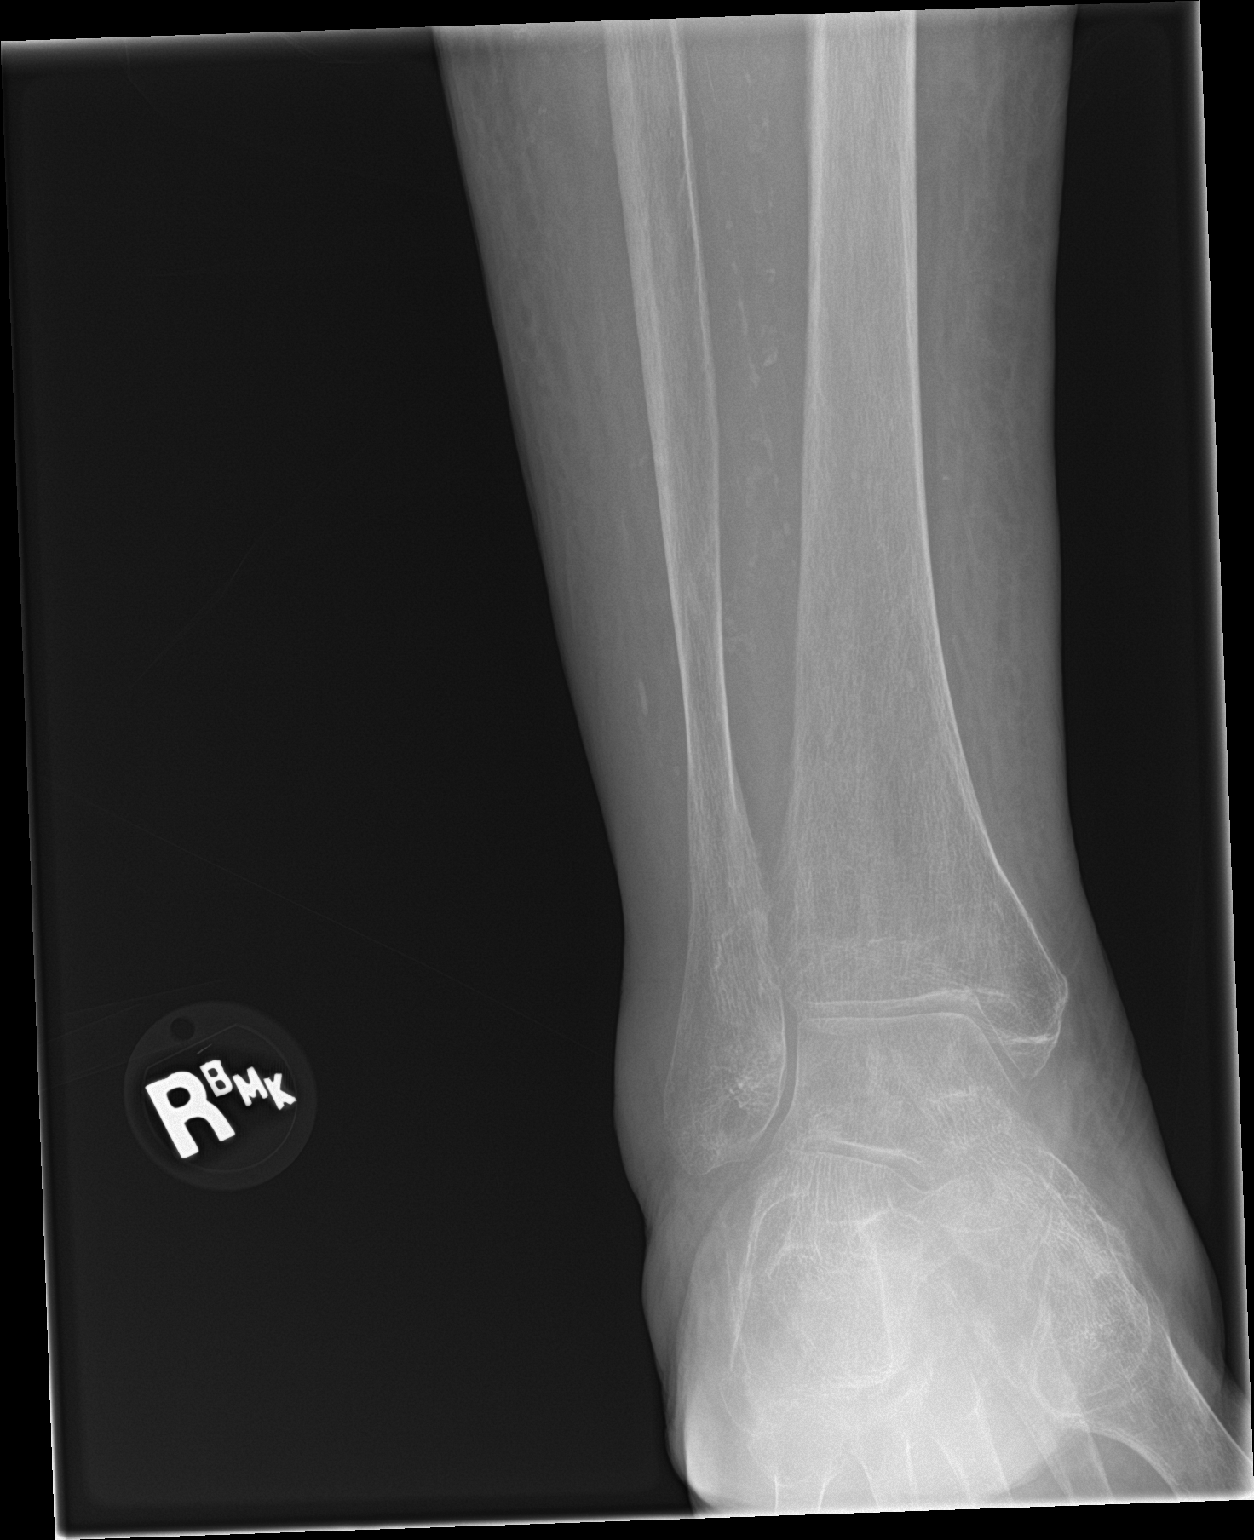

[ankle lat]
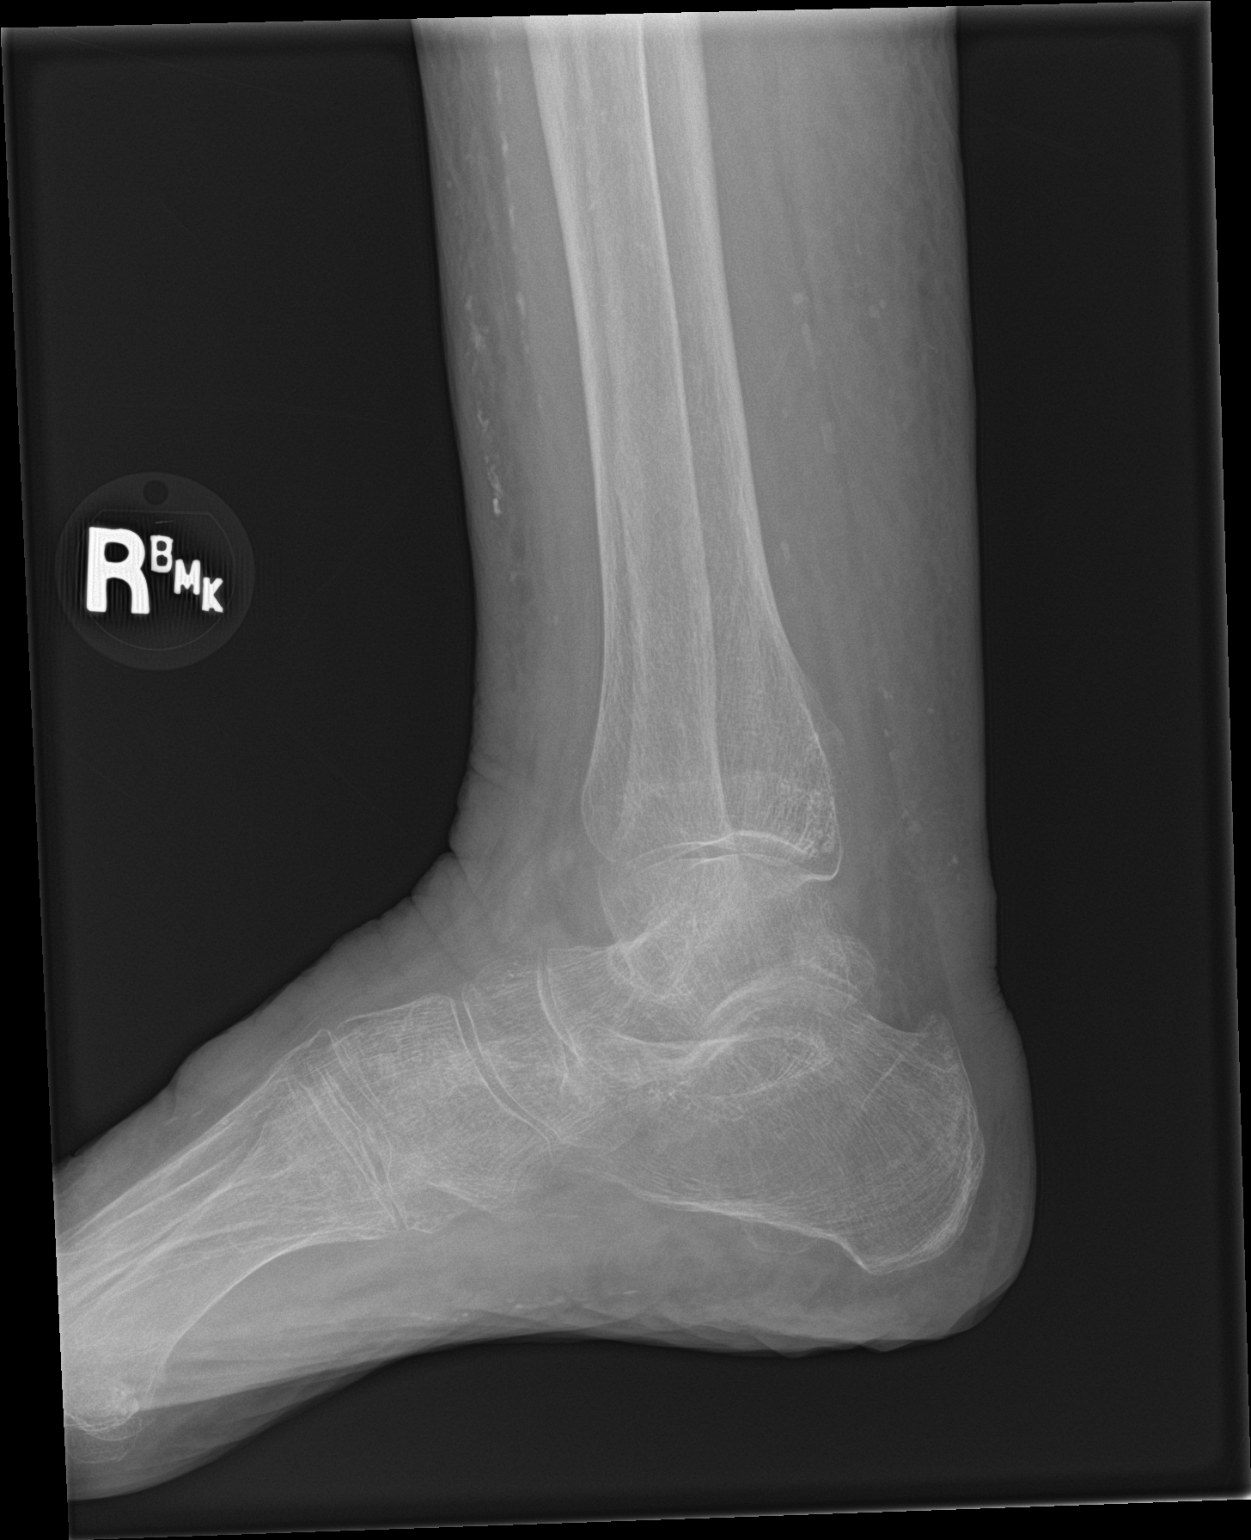

[3 of 3 positions shown; findings below may reference images not displayed]

FINDINGS: No fracture. Ankle mortise is normally spaced and aligned. The bones
are diffusely demineralized.

There is mild diffuse soft tissue edema which predominates
laterally.
IMPRESSION: No fracture or dislocation.

## 2018-05-10 IMAGING — DX DG FOREARM 2V*L*
2 series · 2 of 2 positions shown · non-contrast
Comparison: None.

CLINICAL DATA: Fall from standing at home this evening, c/o left
wrist, left elbow and right ankle pain

EXAM:
LEFT FOREARM - 2 VIEW

[forearm ap]
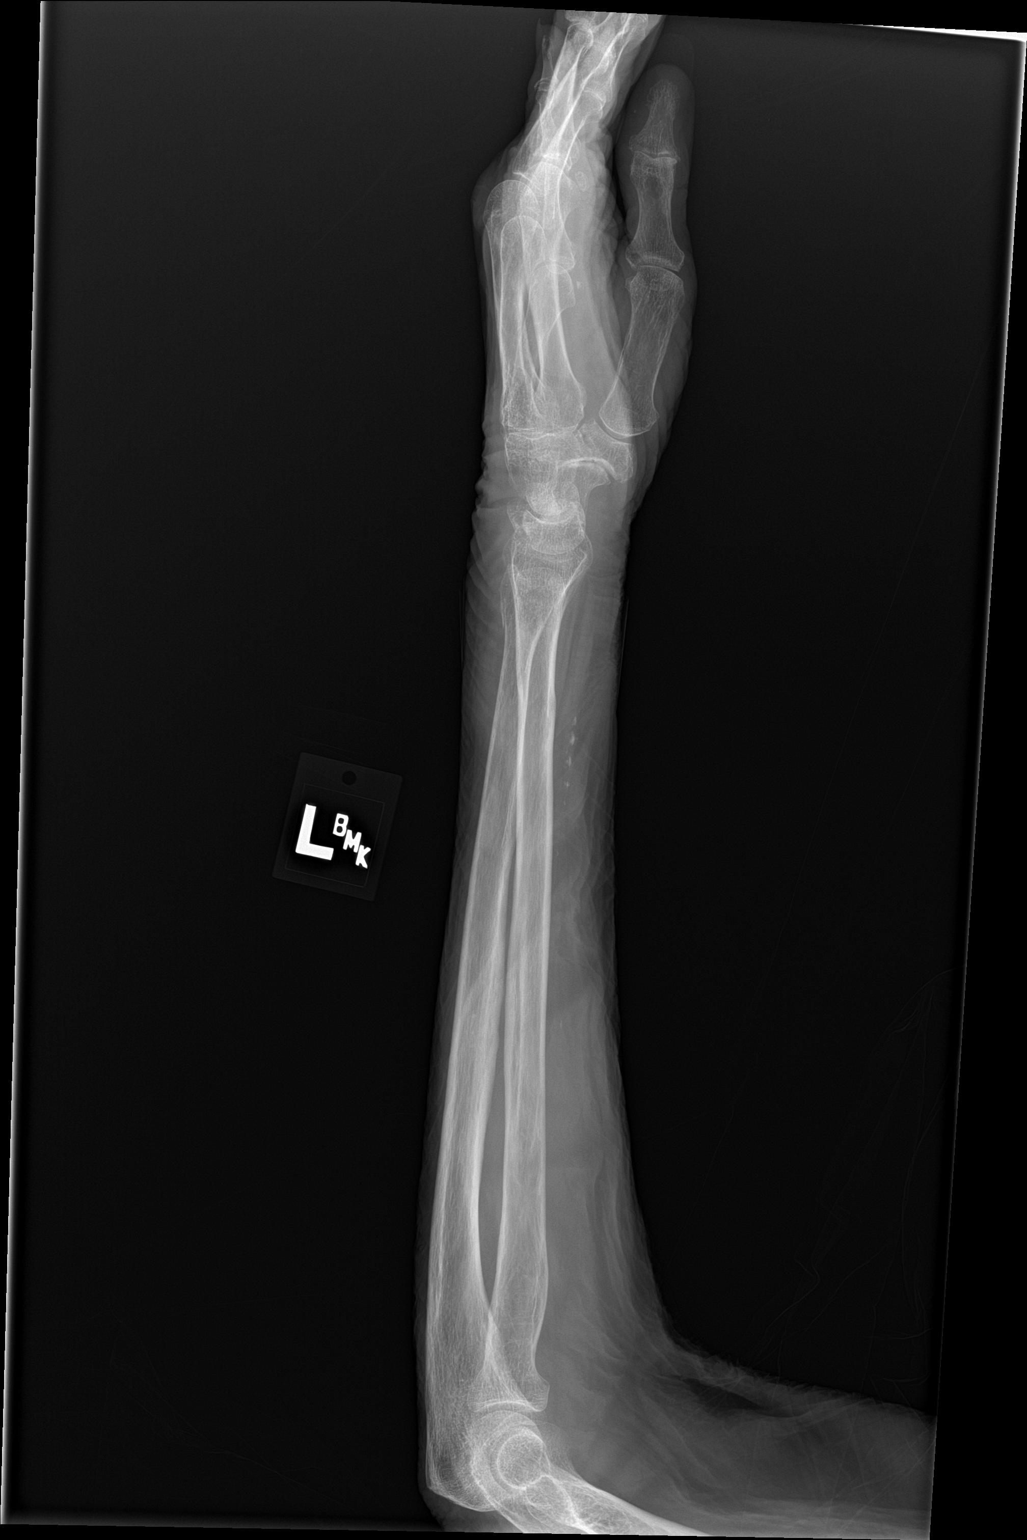

[forearm lat]
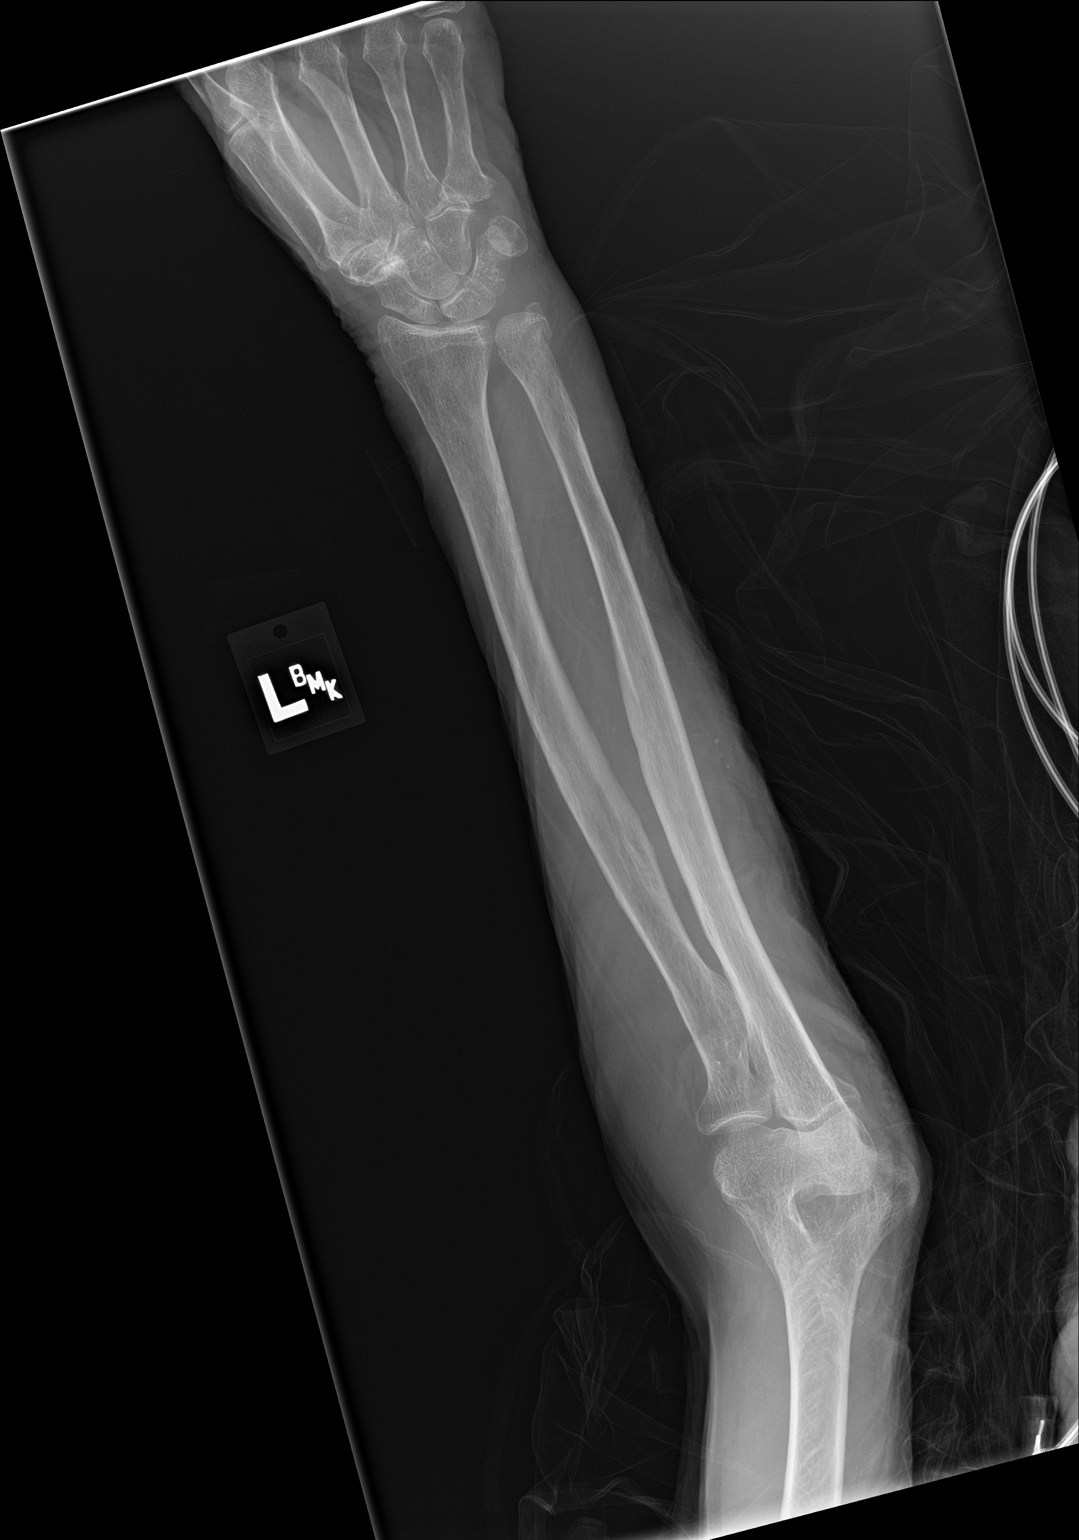

[2 of 2 positions shown; findings below may reference images not displayed]

FINDINGS: There is a nondisplaced fracture of the distal ulna. No other
fractures. The wrist and elbow joints are normally aligned. Bones
are demineralized.

There is ulnar sided wrist soft tissue swelling.
IMPRESSION: Nondisplaced fracture of the distal ulna at the wrist joint. No
dislocation.
